# Patient Record
Sex: Male | Born: 1990 | Race: Black or African American | Hispanic: No | Marital: Single | Smoking: Never smoker
Health system: Southern US, Community
[De-identification: ages and names within clinical notes are randomized; demographics above are authoritative.]

## PROBLEM LIST (undated history)

## (undated) DIAGNOSIS — I10 Essential (primary) hypertension: Secondary | ICD-10-CM

---
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
## 2020-04-14 ENCOUNTER — Emergency Department (HOSPITAL_COMMUNITY)
Admission: EM | Admit: 2020-04-14 | Discharge: 2020-04-14 | Disposition: A | Discharge status: Home / Self Care | Payer: Self-pay | Attending: Emergency Medicine | Admitting: Emergency Medicine

## 2020-04-14 ENCOUNTER — Encounter (HOSPITAL_COMMUNITY): Payer: Self-pay | Admitting: Emergency Medicine

## 2020-04-14 ENCOUNTER — Emergency Department (HOSPITAL_COMMUNITY): Payer: Self-pay

## 2020-04-14 DIAGNOSIS — N50819 Testicular pain, unspecified: Secondary | ICD-10-CM | POA: Insufficient documentation

## 2020-04-14 DIAGNOSIS — R1030 Lower abdominal pain, unspecified: Secondary | ICD-10-CM | POA: Insufficient documentation

## 2020-04-14 DIAGNOSIS — R82998 Other abnormal findings in urine: Secondary | ICD-10-CM | POA: Insufficient documentation

## 2020-04-14 LAB — COMPREHENSIVE METABOLIC PANEL
ALT: 25 U/L (ref 0–44)
AST: 15 U/L (ref 15–41)
Albumin: 4.5 g/dL (ref 3.5–5.0)
Alkaline Phosphatase: 41 U/L (ref 38–126)
Anion gap: 7 (ref 5–15)
BUN: 11 mg/dL (ref 6–20)
CO2: 28 mmol/L (ref 22–32)
Calcium: 9.6 mg/dL (ref 8.9–10.3)
Chloride: 102 mmol/L (ref 98–111)
Creatinine, Ser: 0.9 mg/dL (ref 0.61–1.24)
GFR calc non Af Amer: >60 mL/min (ref >60)
Glucose, Bld: 89 mg/dL (ref 70–99)
Potassium: 4.3 mmol/L (ref 3.5–5.1)
Sodium: 137 mmol/L (ref 135–145)
Total Bilirubin: 0.9 mg/dL (ref 0.3–1.2)
Total Protein: 7.5 g/dL (ref 6.5–8.1)

## 2020-04-14 LAB — URINALYSIS, ROUTINE W REFLEX MICROSCOPIC
Bilirubin Urine: NEGATIVE
Glucose, UA: NEGATIVE mg/dL
Hgb urine dipstick: NEGATIVE
Ketones, ur: NEGATIVE mg/dL
Leukocytes,Ua: NEGATIVE
Nitrite: NEGATIVE
Protein, ur: NEGATIVE mg/dL
Specific Gravity, Urine: 1.006 (ref 1.005–1.030)
pH: 7 (ref 5.0–8.0)

## 2020-04-14 LAB — LIPASE, BLOOD: Lipase: 29 U/L (ref 11–51)

## 2020-04-14 LAB — CBC
HCT: 45.9 % (ref 39.0–52.0)
Hemoglobin: 15.7 g/dL (ref 13.0–17.0)
MCH: 31.1 pg (ref 26.0–34.0)
MCHC: 34.2 g/dL (ref 30.0–36.0)
MCV: 90.9 fL (ref 80.0–100.0)
Platelets: 225 10*3/uL (ref 150–400)
RBC: 5.05 MIL/uL (ref 4.22–5.81)
RDW: 11.4 % — ABNORMAL LOW (ref 11.5–15.5)
WBC: 3.1 10*3/uL — ABNORMAL LOW (ref 4.0–10.5)
nRBC: 0 % (ref 0.0–0.2)

## 2020-04-14 MED ORDER — IOHEXOL 300 MG/ML  SOLN
100.0000 mL | Freq: Once | INTRAMUSCULAR | Status: AC | PRN
  Administered 2020-04-14: 100 mL via INTRAVENOUS

## 2020-04-14 NOTE — ED Triage Notes (Signed)
Per pt, states lower abdominal pain and cramping on and off for 2 days-no other symptoms

## 2020-04-14 NOTE — ED Provider Notes (Signed)
[facility]-EMERGENCY DEPT Provider Note   CSN: [id number] Arrival date & time: 04/14/20  1354     History Chief Complaint  Patient presents with  . Abdominal Pain    John Collier is a 29 y.o. male.  He has no significant past medical history.  Complaining of 2 days of crampy low abdominal pain and strong urine smell.  No dysuria.  Denies any penile discharge.  Some radiation of pain into the testicles.  Last moved his bowels 2 days ago.  No fevers or chills.  No chest pain or shortness of breath.  No nausea or vomiting.  No prior abdominal surgeries  The history is provided by the patient.  Abdominal Cramping This is a new problem. The current episode started 2 days ago. The problem occurs constantly. The problem has not changed since onset.Associated symptoms include abdominal pain. Pertinent negatives include no chest pain, no headaches and no shortness of breath. Nothing aggravates the symptoms. Nothing relieves the symptoms. He has tried nothing for the symptoms. The treatment provided no relief.       History reviewed. No pertinent past medical history.  There are no problems to display for this patient.   History reviewed. No pertinent surgical history.     No family history on file.  Social History   Tobacco Use  . Smoking status: Not on file  Substance Use Topics  . Alcohol use: Not on file  . Drug use: Not on file    Home Medications Prior to Admission medications   Not on File    Allergies    Patient has no allergy information on record.  Review of Systems   Review of Systems  Constitutional: Negative for fever.  HENT: Negative for sore throat.   Eyes: Negative for visual disturbance.  Respiratory: Negative for shortness of breath.   Cardiovascular: Negative for chest pain.  Gastrointestinal: Positive for abdominal pain.  Genitourinary: Negative for dysuria.  Musculoskeletal: Negative for back pain.  Skin: Negative for rash.   Neurological: Negative for headaches.    Physical Exam Updated Vital Signs BP (!) 162/96 (BP Location: Left Arm)   Pulse (!) 57   Temp 99 F (37.2 C) (Oral)   Resp 18   SpO2 99%   Physical Exam Vitals and nursing note reviewed.  Constitutional:      Appearance: He is well-developed.  HENT:     Head: Normocephalic and atraumatic.  Eyes:     Conjunctiva/sclera: Conjunctivae normal.  Cardiovascular:     Rate and Rhythm: Normal rate and regular rhythm.     Heart sounds: No murmur heard.   Pulmonary:     Effort: Pulmonary effort is normal. No respiratory distress.     Breath sounds: Normal breath sounds.  Abdominal:     Palpations: Abdomen is soft.     Tenderness: There is no abdominal tenderness.  Genitourinary:    Testes: Normal.        Right: Mass or swelling not present.        Left: Mass or swelling not present.  Musculoskeletal:        General: No deformity or signs of injury. Normal range of motion.     Cervical back: Neck supple.  Skin:    General: Skin is warm and dry.     Capillary Refill: Capillary refill takes less than 2 seconds.  Neurological:     General: No focal deficit present.     Mental Status: He is alert.  ED Results / Procedures / Treatments   Labs (all labs ordered are listed, but only abnormal results are displayed) Labs Reviewed  CBC - Abnormal; Notable for the following components:      Result Value   WBC 3.1 (*)    RDW 11.4 (*)    All other components within normal limits  URINALYSIS, ROUTINE W REFLEX MICROSCOPIC - Abnormal; Notable for the following components:   Color, Urine STRAW (*)    All other components within normal limits  LIPASE, BLOOD  COMPREHENSIVE METABOLIC PANEL    EKG None  Radiology CT Abdomen Pelvis W Contrast  Result Date: 04/14/2020 CLINICAL DATA:  RLQ abdominal pain, appendicitis suspected EXAM: CT ABDOMEN AND PELVIS WITH CONTRAST TECHNIQUE: Multidetector CT imaging of the abdomen and pelvis was  performed using the standard protocol following bolus administration of intravenous contrast. CONTRAST:  OMNIPAQUE IOHEXOL 300 MG/ML  SOLN COMPARISON:  None. FINDINGS: Lower chest: No acute abnormality. Left lower lobe subpleural micronodule likely represents atelectasis. Hepatobiliary: No focal liver abnormality. No gallstones, gallbladder wall thickening, or pericholecystic fluid. No biliary dilatation. Pancreas: No focal lesion. Normal pancreatic contour. No surrounding inflammatory changes. No main pancreatic ductal dilatation. Spleen: Normal in size without focal abnormality. A splenule is noted inferior to the spleen. Adrenals/Urinary Tract: No adrenal nodule bilaterally. Bilateral kidneys enhance symmetrically. No hydronephrosis. No hydroureter. The urinary bladder is unremarkable. Stomach/Bowel: Stomach is within normal limits. Suggestion of a tubular blind ending structure within the right lower quadrant with intraluminal gas (5:38-44) may represent the appendix. No evidence of bowel wall thickening, distention, or inflammatory changes. Vascular/Lymphatic: No significant vascular findings are present. No enlarged abdominal or pelvic lymph nodes. Reproductive: Prostate is unremarkable. Other: No intraperitoneal free fluid. No intraperitoneal free gas. No organized fluid collection. Musculoskeletal: No acute or significant osseous findings. IMPRESSION: 1. The appendix is not definitely identified with no right lower quadrant inflammatory changes to suggest acute appendicitis. Limited evaluation due to paucity of intraperitoneal fat. 2. Otherwise no acute intra-abdominal intrapelvic abnormality. Electronically Signed   By: Tish Frederickson M.D.   On: 04/14/2020 18:49    Procedures Procedures (including critical care time)  Medications Ordered in ED Medications  iohexol (OMNIPAQUE) 300 MG/ML solution 100 mL (100 mLs Intravenous Contrast Given 04/14/20 1823)    ED Course  I have reviewed the  triage vital signs and the nursing notes.  Pertinent labs & imaging results that were available during my care of the patient were reviewed by me and considered in my medical decision making (see chart for details).    MDM Rules/Calculators/A&P                         Healthy male here with 2 days of crampy low abdominal pain and some vague urinary symptoms.  Exam benign and normal vitals.  Lab work does not show any significant abnormalities other than a nonspecific low white blood cell count.  Urinalysis without signs of infection.  Ordered CT imaging to exclude diverticulitis appendicitis and although the appendix was not identified did not see any inflammatory changes.  When I reviewed the images he does look like he has a large amount of stool.  Reviewed all his work-up with him.  Recommended increasing fluids and fiber and possibly trying a laxative.  Return instructions discussed.  Final Clinical Impression(s) / ED Diagnoses Final diagnoses:  Lower abdominal pain    Rx / DC Orders ED Discharge Orders    None  Terrilee Files, MD 04/15/20 1047

## 2020-04-14 NOTE — ED Notes (Signed)
Patient transported to CT 

## 2020-04-14 NOTE — Discharge Instructions (Signed)
You were seen in the emergency department for lower abdominal pain and cramps.  You had blood work did not show any serious findings along with a normal urinalysis.  Your CAT scan also did not show an obvious explanation for your pain.  This may be related to constipation and you should try an over-the-counter laxative.  Drink plenty of fluids and increase your fiber intake.  Return to the emergency department for any fever or worsening symptoms.

## 2020-04-14 NOTE — ED Notes (Signed)
Urine culture sent to lab.

## 2020-04-15 ENCOUNTER — Encounter (HOSPITAL_COMMUNITY): Payer: Self-pay | Admitting: Emergency Medicine

## 2020-07-03 ENCOUNTER — Emergency Department (HOSPITAL_COMMUNITY)
Admission: EM | Admit: 2020-07-03 | Discharge: 2020-07-03 | Disposition: A | Discharge status: Home / Self Care | Payer: Self-pay | Attending: Emergency Medicine | Admitting: Emergency Medicine

## 2020-07-03 ENCOUNTER — Other Ambulatory Visit: Payer: Self-pay

## 2020-07-03 DIAGNOSIS — I10 Essential (primary) hypertension: Secondary | ICD-10-CM | POA: Insufficient documentation

## 2020-07-03 DIAGNOSIS — R0789 Other chest pain: Secondary | ICD-10-CM | POA: Insufficient documentation

## 2020-07-03 LAB — BASIC METABOLIC PANEL
Anion gap: 10 (ref 5–15)
BUN: 10 mg/dL (ref 6–20)
CO2: 25 mmol/L (ref 22–32)
Calcium: 9.6 mg/dL (ref 8.9–10.3)
Chloride: 104 mmol/L (ref 98–111)
Creatinine, Ser: 0.95 mg/dL (ref 0.61–1.24)
GFR, Estimated: >60 mL/min (ref >60)
Glucose, Bld: 82 mg/dL (ref 70–99)
Potassium: 4.1 mmol/L (ref 3.5–5.1)
Sodium: 139 mmol/L (ref 135–145)

## 2020-07-03 LAB — CBC
HCT: 45.2 % (ref 39.0–52.0)
Hemoglobin: 15.8 g/dL (ref 13.0–17.0)
MCH: 31.8 pg (ref 26.0–34.0)
MCHC: 35 g/dL (ref 30.0–36.0)
MCV: 90.9 fL (ref 80.0–100.0)
Platelets: 237 10*3/uL (ref 150–400)
RBC: 4.97 MIL/uL (ref 4.22–5.81)
RDW: 11 % — ABNORMAL LOW (ref 11.5–15.5)
WBC: 4.5 10*3/uL (ref 4.0–10.5)
nRBC: 0 % (ref 0.0–0.2)

## 2020-07-03 LAB — TROPONIN I (HIGH SENSITIVITY): Troponin I (High Sensitivity): 9 ng/L (ref <18)

## 2020-07-03 MED ORDER — IBUPROFEN 400 MG PO TABS
600.0000 mg | ORAL_TABLET | Freq: Once | ORAL | Status: AC
  Administered 2020-07-03: 600 mg via ORAL
  Filled 2020-07-03: qty 1

## 2020-07-03 NOTE — Discharge Instructions (Addendum)
You may alternate Tylenol 1000 mg every 6 hours as needed for pain, fever and Ibuprofen 800 mg every 8 hours as needed for pain, fever.  Please take Ibuprofen with food.  Do not take more than 4000 mg of Tylenol (acetaminophen) in a 24 hour period.  Steps to find a Primary Care Provider (PCP):  Call [phone] or [phone] to access "[facility] Find a Doctor Service."  2.  You may also go on the [facility] website at [email or web address]  3.  [facility] also frequently accepts new patients.  [facility]  [address] [place] [address] [phone]  4.  There are also multiple [facility], [facility], [facility] and [facility] practices throughout the [place] that are frequently accepting new patients. You may find a clinic that is close to your home and contact them.  [email or web address] [phone]  [email or web address]  [email or web address] [phone]  [email or web address] [phone]  5.  Local Health Departments also can provide primary care services.  [facility]  [address] [place], [place] [address] [phone]  [facility] [address] [place] [address] [phone]  [facility] [phone]  [place] [address] [phone]   

## 2020-07-03 NOTE — ED Notes (Signed)
E-signature pad unavailable at time of pt discharge. This RN discussed discharge materials with pt and answered all pt questions. Pt stated understanding of discharge material. ? ?

## 2020-07-03 NOTE — ED Notes (Signed)
Pt refused x-ray when x-ray transport came. This RN explained the reason behind his ordered x-ray. Pt acknowledged reasoning and refused.

## 2020-07-03 NOTE — ED Triage Notes (Signed)
Pt presents to ED POV. Pt c/o L CP that radiates towards neck. Pt reports that he feels like his HR is irregular, he feels like it speeds up and slows down.

## 2020-07-03 NOTE — ED Provider Notes (Signed)
TIME SEEN: 4:10 AM  CHIEF COMPLAINT: Chest pain  HPI: Patient is a 29 year old male with previous history of hypertension no longer on medication who presents to the emergency department with left stabbing and tight chest pain for the past couple of days.  No aggravating or alleviating factors.  Pain is not exertional or pleuritic.  No shortness of breath, nausea, vomiting, diaphoresis or dizziness.  No fever or cough.  No history of PE, DVT, exogenous estrogen use, recent fractures, surgery, trauma, hospitalization, prolonged travel or other immobilization. No lower extremity swelling or pain. No calf tenderness.  States he felt like his heart rate was irregular like it was speeding up and slowing down which is his main reason for coming to the emergency department.  ROS: See HPI Constitutional: no fever  Eyes: no drainage  ENT: no runny nose   Cardiovascular:   chest pain  Resp: no SOB  GI: no vomiting GU: no dysuria Integumentary: no rash  Allergy: no hives  Musculoskeletal: no leg swelling  Neurological: no slurred speech ROS otherwise negative  PAST MEDICAL HISTORY/PAST SURGICAL HISTORY:  No past medical history on file.  MEDICATIONS:  Prior to Admission medications   Not on File    ALLERGIES:  No Known Allergies  SOCIAL HISTORY:  Social History   Tobacco Use  . Smoking status: Not on file  . Smokeless tobacco: Not on file  Substance Use Topics  . Alcohol use: Not on file    FAMILY HISTORY: No family history on file.  EXAM: BP (!) 154/107   Pulse 65   Temp 98.1 F (36.7 C) (Oral)   Resp 18   SpO2 100%  CONSTITUTIONAL: Alert and oriented and responds appropriately to questions. Well-appearing; well-nourished HEAD: Normocephalic EYES: Conjunctivae clear, pupils appear equal, EOM appear intact ENT: normal nose; moist mucous membranes NECK: Supple, normal ROM CARD: RRR; S1 and S2 appreciated; no murmurs, no clicks, no rubs, no gallops CHEST:  Chest wall is  nontender to palpation.  No crepitus, ecchymosis, erythema, warmth, rash or other lesions present.   RESP: Normal chest excursion without splinting or tachypnea; breath sounds clear and equal bilaterally; no wheezes, no rhonchi, no rales, no hypoxia or respiratory distress, speaking full sentences ABD/GI: Normal bowel sounds; non-distended; soft, non-tender, no rebound, no guarding, no peritoneal signs, no hepatosplenomegaly BACK:  The back appears normal EXT: Normal ROM in all joints; no deformity noted, no edema; no cyanosis; no calf tenderness or calf swelling SKIN: Normal color for age and race; warm; no rash on exposed skin NEURO: Moves all extremities equally PSYCH: The patient's mood and manner are appropriate.   MEDICAL DECISION MAKING: Patient here with atypical chest pain.  Pain has been ongoing for 2 days.  He has had a negative high-sensitivity troponin here.  He is PERC negative.  He is hypertensive here and states that he took himself off of blood pressure medication a couple years ago.  He has had some intermittent normal blood pressures in the 140s/80s.  He is asymptomatic of his hypertension other than atypical chest pain.  I do not think the he is having a dissection, ACS or PE.  I did discuss with him that I recommend close follow-up with outpatient provider to discuss starting him back on blood pressure medications.  Recommended chest x-ray.  Will give ibuprofen for pain.  EKG nonischemic.  ED PROGRESS: Patient now refusing chest x-ray.  Risk and benefits have been discussed with him.  He does not appear volume  overloaded.  No infectious symptoms to suggest pneumonia.  I do not think this is a pneumothorax as he has equal breath sounds bilaterally and is not hypoxic.  I feel he is safe for discharge.  Discussed return precautions.  Given outpatient PCP follow-up information.  At this time, I do not feel there is any life-threatening condition present. I have reviewed, interpreted and  discussed all results (EKG, imaging, lab, urine as appropriate) and exam findings with patient/family. I have reviewed nursing notes and appropriate previous records.  I feel the patient is safe to be discharged home without further emergent workup and can continue workup as an outpatient as needed. Discussed usual and customary return precautions. Patient/family verbalize understanding and are comfortable with this plan.  Outpatient follow-up has been provided as needed. All questions have been answered.     EKG Interpretation  Date/Time:  Friday July 03 2020 01:24:01 EST Ventricular Rate:  75 PR Interval:  144 QRS Duration: 90 QT Interval:  368 QTC Calculation: 410 R Axis:   76 Text Interpretation: Normal sinus rhythm Normal ECG No old tracing to compare Confirmed by Joia Doyle, Baxter Hire [phone]) on 07/03/2020 4:09:21 AM           Drucie Ip was evaluated in Emergency Department on 07/03/2020 for the symptoms described in the history of present illness. He was evaluated in the context of the global COVID-19 pandemic, which necessitated consideration that the patient might be at risk for infection with the SARS-CoV-2 virus that causes COVID-19. Institutional protocols and algorithms that pertain to the evaluation of patients at risk for COVID-19 are in a state of rapid change based on information released by regulatory bodies including the CDC and federal and state organizations. These policies and algorithms were followed during the patient's care in the ED.      Trenell Moxey, Layla Maw, DO 07/03/20 [phone]

## 2020-09-07 ENCOUNTER — Emergency Department (HOSPITAL_COMMUNITY)
Admission: EM | Admit: 2020-09-07 | Discharge: 2020-09-07 | Disposition: A | Discharge status: Home / Self Care | Payer: Self-pay | Attending: Emergency Medicine | Admitting: Emergency Medicine

## 2020-09-07 ENCOUNTER — Encounter (HOSPITAL_COMMUNITY): Payer: Self-pay

## 2020-09-07 ENCOUNTER — Other Ambulatory Visit: Payer: Self-pay

## 2020-09-07 DIAGNOSIS — I10 Essential (primary) hypertension: Secondary | ICD-10-CM | POA: Insufficient documentation

## 2020-09-07 DIAGNOSIS — R350 Frequency of micturition: Secondary | ICD-10-CM | POA: Insufficient documentation

## 2020-09-07 HISTORY — DX: Essential (primary) hypertension: I10

## 2020-09-07 LAB — URINALYSIS, ROUTINE W REFLEX MICROSCOPIC
Bilirubin Urine: NEGATIVE
Glucose, UA: NEGATIVE mg/dL
Hgb urine dipstick: NEGATIVE
Ketones, ur: NEGATIVE mg/dL
Leukocytes,Ua: NEGATIVE
Nitrite: NEGATIVE
Protein, ur: NEGATIVE mg/dL
Specific Gravity, Urine: 1.012 (ref 1.005–1.030)
pH: 7 (ref 5.0–8.0)

## 2020-09-07 NOTE — ED Triage Notes (Signed)
Pt c/o urinary frequency x 3 days w lower abd pain (intermittent). Denies burning or foul smell w urination. Denies n/v/d.

## 2020-09-07 NOTE — Discharge Instructions (Signed)
I strongly recommend following up with your primary care doctor regarding your symptoms from today.  If your gonorrhea/chlamydia test is positive, you will need further treatment.  If you develop any testicle pain, abdominal pain, vomiting, fevers or other new concerning symptom, recommend return to ER for reassessment.

## 2020-09-07 NOTE — ED Provider Notes (Signed)
[facility]-EMERGENCY DEPT Provider Note   CSN: [id number] Arrival date & time: 09/07/20  1834     History Chief Complaint  Patient presents with  . Urinary Frequency    John Collier is a 30 y.o. male.  Presented to ER with concern for increased urinary frequency.  Patient reports that over the last 3 days he feels like he has been urinating more frequently.  Also felt like he had some lower abdominal pain at one point earlier today when he was sitting in certain positions.  He does not have any ongoing pain at present.  No vomiting, fever.  No burning or foul-smelling urine.  No penile discharge.  No testicular pain or swelling at present.  Sexually active with 2 male partners over the past few months.  No known STD exposures.  Use condoms most of the time.  Reports that he had been tested for STDs a few months ago and was negative.  HPI     Past Medical History:  Diagnosis Date  . Hypertension     There are no problems to display for this patient.   History reviewed. No pertinent surgical history.     No family history on file.  Social History   Tobacco Use  . Smoking status: Never Smoker  . Smokeless tobacco: Never Used  Substance Use Topics  . Alcohol use: Not Currently  . Drug use: Not Currently    Home Medications Prior to Admission medications   Not on File    Allergies    Patient has no known allergies.  Review of Systems   Review of Systems  Constitutional: Negative for chills and fever.  HENT: Negative for ear pain and sore throat.   Eyes: Negative for pain and visual disturbance.  Respiratory: Negative for cough and shortness of breath.   Cardiovascular: Negative for chest pain and palpitations.  Gastrointestinal: Negative for abdominal pain and vomiting.  Genitourinary: Positive for frequency. Negative for dysuria and hematuria.  Musculoskeletal: Negative for arthralgias and back pain.  Skin: Negative for color change  and rash.  Neurological: Negative for seizures and syncope.  All other systems reviewed and are negative.   Physical Exam Updated Vital Signs BP (!) 150/95 (BP Location: Right Arm)   Pulse 99   Temp 99.1 F (37.3 C) (Oral)   Resp 16   Ht 5\' 8"  (1.727 m)   Wt 65.6 kg   SpO2 100%   BMI 21.99 kg/m   Physical Exam Vitals and nursing note reviewed.  Constitutional:      Appearance: He is well-developed and well-nourished.  HENT:     Head: Normocephalic and atraumatic.  Eyes:     Conjunctiva/sclera: Conjunctivae normal.  Cardiovascular:     Rate and Rhythm: Normal rate.     Pulses: Normal pulses.  Pulmonary:     Effort: Pulmonary effort is normal. No respiratory distress.  Abdominal:     General: Abdomen is flat. There is no distension.     Palpations: Abdomen is soft. There is no mass.     Tenderness: There is no abdominal tenderness.  Genitourinary:    Penis: Normal.      Testes: Normal.     Comments: Rebecca RN chaperone  Normal-appearing penis, normal-appearing testicles, no swelling, no tenderness, cremasteric reflex intact, no hernia in bilateral inguinal region Musculoskeletal:        General: No edema.     Cervical back: Neck supple.  Skin:    General: Skin is  warm and dry.  Neurological:     General: No focal deficit present.     Mental Status: He is alert.  Psychiatric:        Mood and Affect: Mood and affect and mood normal.        Behavior: Behavior normal.     ED Results / Procedures / Treatments   Labs (all labs ordered are listed, but only abnormal results are displayed) Labs Reviewed  URINALYSIS, ROUTINE W REFLEX MICROSCOPIC  GC/CHLAMYDIA PROBE AMP () NOT AT [facility]    EKG None  Radiology No results found.  Procedures Procedures   Medications Ordered in ED Medications - No data to display  ED Course  I have reviewed the triage vital signs and the nursing notes.  Pertinent labs & imaging results that were available during  my care of the patient were reviewed by me and considered in my medical decision making (see chart for details).    MDM Rules/Calculators/A&P                         30 year old male presents to ER with concern for increased urinary frequency.  On physical exam, he is noted to be well-appearing in no distress with normal vital signs.  He had no tenderness on my abdominal exam.  GU exam was unremarkable.  UA negative for infection.  Given reassuring exam, UA, recommend observation at present.  Discussed return precautions should he develop other associated symptoms.  Recommended follow-up with primary.  After the discussed management above, the patient was determined to be safe for discharge.  The patient was in agreement with this plan and all questions regarding their care were answered.  ED return precautions were discussed and the patient will return to the ED with any significant worsening of condition.    Final Clinical Impression(s) / ED Diagnoses Final diagnoses:  Urinary frequency    Rx / DC Orders ED Discharge Orders    None       Milagros Loll, MD 09/07/20 2015

## 2020-09-07 NOTE — ED Notes (Signed)
An After Visit Summary was printed and given to the patient. Discharge instructions given and no further questions at this time.  

## 2021-02-19 ENCOUNTER — Emergency Department (HOSPITAL_COMMUNITY): Payer: Self-pay

## 2021-02-19 ENCOUNTER — Emergency Department (HOSPITAL_COMMUNITY)
Admission: EM | Admit: 2021-02-19 | Discharge: 2021-02-19 | Disposition: A | Discharge status: Home / Self Care | Payer: Self-pay | Attending: Emergency Medicine | Admitting: Emergency Medicine

## 2021-02-19 ENCOUNTER — Other Ambulatory Visit: Payer: Self-pay

## 2021-02-19 ENCOUNTER — Encounter (HOSPITAL_COMMUNITY): Payer: Self-pay | Admitting: Emergency Medicine

## 2021-02-19 DIAGNOSIS — I10 Essential (primary) hypertension: Secondary | ICD-10-CM | POA: Insufficient documentation

## 2021-02-19 DIAGNOSIS — R002 Palpitations: Secondary | ICD-10-CM | POA: Insufficient documentation

## 2021-02-19 DIAGNOSIS — R079 Chest pain, unspecified: Secondary | ICD-10-CM | POA: Insufficient documentation

## 2021-02-19 LAB — BASIC METABOLIC PANEL
Anion gap: 8 (ref 5–15)
BUN: 11 mg/dL (ref 6–20)
CO2: 26 mmol/L (ref 22–32)
Calcium: 9.2 mg/dL (ref 8.9–10.3)
Chloride: 106 mmol/L (ref 98–111)
Creatinine, Ser: 0.86 mg/dL (ref 0.61–1.24)
GFR, Estimated: >60 mL/min (ref >60)
Glucose, Bld: 75 mg/dL (ref 70–99)
Potassium: 3.7 mmol/L (ref 3.5–5.1)
Sodium: 140 mmol/L (ref 135–145)

## 2021-02-19 LAB — CBC
HCT: 46.6 % (ref 39.0–52.0)
Hemoglobin: 15.6 g/dL (ref 13.0–17.0)
MCH: 30.5 pg (ref 26.0–34.0)
MCHC: 33.5 g/dL (ref 30.0–36.0)
MCV: 91.2 fL (ref 80.0–100.0)
Platelets: 216 10*3/uL (ref 150–400)
RBC: 5.11 MIL/uL (ref 4.22–5.81)
RDW: 11.2 % — ABNORMAL LOW (ref 11.5–15.5)
WBC: 3.7 10*3/uL — ABNORMAL LOW (ref 4.0–10.5)
nRBC: 0 % (ref 0.0–0.2)

## 2021-02-19 LAB — TROPONIN I (HIGH SENSITIVITY): Troponin I (High Sensitivity): <2 ng/L (ref <18)

## 2021-02-19 MED ORDER — LISINOPRIL 5 MG PO TABS: 5.0000 mg | ORAL_TABLET | Freq: Every day | ORAL | 0 refills | Status: AC

## 2021-02-19 NOTE — Discharge Instructions (Addendum)
Take lisinopril 5 mg daily.  You need to follow-up with the wellness clinic to recheck her blood pressure  Return to ER if you have worse palpitations, chest pain, shortness of breath

## 2021-02-19 NOTE — ED Triage Notes (Addendum)
Pt reports left sided chest pain and palpitations x 1 week. Pt concerned sx are related to his elevated BP. Hx of HTN.

## 2021-02-19 NOTE — ED Provider Notes (Signed)
[facility]-EMERGENCY DEPT Provider Note   CSN: [id number] Arrival date & time: 02/19/21  1918     History Chief Complaint  Patient presents with   Chest Pain   Palpitations    John Collier is a 30 y.o. male hx of HTN, here with palpitations, hypertension.  Patient states that he has a history of hypertension supposed to be on lisinopril 10 mg daily.  He has stopped taking it about 10 years ago.  He states that the last several days he noticed some palpitations.  He denies any chest pain.  He states that last time he had the symptoms, he was noted to have elevated blood pressure.  Denies any chest pain or shortness of breath  The history is provided by the patient.      Past Medical History:  Diagnosis Date   Hypertension     There are no problems to display for this patient.   History reviewed. No pertinent surgical history.     No family history on file.  Social History   Tobacco Use   Smoking status: Never   Smokeless tobacco: Never  Substance Use Topics   Alcohol use: Not Currently   Drug use: Not Currently    Home Medications Prior to Admission medications   Not on File    Allergies    Patient has no known allergies.  Review of Systems   Review of Systems  Cardiovascular:  Positive for chest pain.  All other systems reviewed and are negative.  Physical Exam Updated Vital Signs BP (!) 136/92 (BP Location: Left Arm)   Pulse 77   Temp 98.1 F (36.7 C) (Oral)   Resp 13   Ht 5\' 8"  (1.727 m)   Wt 65.3 kg   SpO2 99%   BMI 21.90 kg/m   Physical Exam Vitals and nursing note reviewed.  Constitutional:      Appearance: He is well-developed.  HENT:     Head: Normocephalic.  Eyes:     Pupils: Pupils are equal, round, and reactive to light.  Cardiovascular:     Rate and Rhythm: Normal rate and regular rhythm.     Heart sounds: Normal heart sounds.  Pulmonary:     Effort: Pulmonary effort is normal.     Breath sounds:  Normal breath sounds.  Abdominal:     General: Bowel sounds are normal.     Palpations: Abdomen is soft.  Musculoskeletal:        General: Normal range of motion.     Cervical back: Normal range of motion.  Skin:    General: Skin is warm.     Capillary Refill: Capillary refill takes less than 2 seconds.  Neurological:     General: No focal deficit present.     Mental Status: He is alert and oriented to person, place, and time.  Psychiatric:        Mood and Affect: Mood normal.        Behavior: Behavior normal.    ED Results / Procedures / Treatments   Labs (all labs ordered are listed, but only abnormal results are displayed) Labs Reviewed  CBC - Abnormal; Notable for the following components:      Result Value   WBC 3.7 (*)    RDW 11.2 (*)    All other components within normal limits  BASIC METABOLIC PANEL  TROPONIN I (HIGH SENSITIVITY)    EKG EKG Interpretation  Date/Time:  Friday February 19 2021 19:27:25 EDT Ventricular  Rate:  76 PR Interval:  135 QRS Duration: 82 QT Interval:  352 QTC Calculation: 396 R Axis:   71 Text Interpretation: Sinus rhythm Consider left atrial enlargement LVH by voltage ST elevation suggests acute pericarditis 12 Lead; Mason-Likar No significant change since last tracing Confirmed by Richardean Canal [phone]) on 02/19/2021 7:55:30 PM  Radiology DG Chest 2 View  Result Date: 02/19/2021 CLINICAL DATA:  Chest pain for several days EXAM: CHEST - 2 VIEW COMPARISON:  08/13/2006 FINDINGS: The heart size and mediastinal contours are within normal limits. Both lungs are clear. The visualized skeletal structures are unremarkable. IMPRESSION: No active cardiopulmonary disease. Electronically Signed   By: Alcide Clever M.D.   On: 02/19/2021 19:41    Procedures Procedures   Medications Ordered in ED Medications - No data to display  ED Course  I have reviewed the triage vital signs and the nursing notes.  Pertinent labs & imaging results that were  available during my care of the patient were reviewed by me and considered in my medical decision making (see chart for details).    MDM Rules/Calculators/A&P                          John Collier is a 30 y.o. male presenting with palpitations.  Patient has been having palpitations for the last week or so.  Patient's heart rate is in the 70s.  Patient is slightly hypertensive to the 150s. Patient was supposed to be on lisinopril.  We will check basic blood work and troponin x1.  Patient has no history of hyperthyroidism and has no obvious goiter so we will hold off on checking TSH.   9:17 PM Labs unremarkable.  BP down to 131/96 with no intervention. Will start lisinopril 5 mg daily    Final Clinical Impression(s) / ED Diagnoses Final diagnoses:  None    Rx / DC Orders ED Discharge Orders     None        Charlynne Pander, MD 02/19/21 2117

## 2021-04-22 ENCOUNTER — Emergency Department (HOSPITAL_COMMUNITY)
Admission: EM | Admit: 2021-04-22 | Discharge: 2021-04-23 | Disposition: A | Discharge status: Home / Self Care | Payer: Self-pay | Attending: Emergency Medicine | Admitting: Emergency Medicine

## 2021-04-22 ENCOUNTER — Other Ambulatory Visit: Payer: Self-pay

## 2021-04-22 DIAGNOSIS — Z79899 Other long term (current) drug therapy: Secondary | ICD-10-CM | POA: Insufficient documentation

## 2021-04-22 DIAGNOSIS — I10 Essential (primary) hypertension: Secondary | ICD-10-CM | POA: Insufficient documentation

## 2021-04-22 DIAGNOSIS — K602 Anal fissure, unspecified: Secondary | ICD-10-CM | POA: Insufficient documentation

## 2021-04-22 LAB — CBC WITH DIFFERENTIAL/PLATELET
Abs Immature Granulocytes: 0 10*3/uL (ref 0.00–0.07)
Basophils Absolute: 0 10*3/uL (ref 0.0–0.1)
Basophils Relative: 1 %
Eosinophils Absolute: 0 10*3/uL (ref 0.0–0.5)
Eosinophils Relative: 1 %
HCT: 45.7 % (ref 39.0–52.0)
Hemoglobin: 15.6 g/dL (ref 13.0–17.0)
Immature Granulocytes: 0 %
Lymphocytes Relative: 43 %
Lymphs Abs: 1.9 10*3/uL (ref 0.7–4.0)
MCH: 30.9 pg (ref 26.0–34.0)
MCHC: 34.1 g/dL (ref 30.0–36.0)
MCV: 90.5 fL (ref 80.0–100.0)
Monocytes Absolute: 0.4 10*3/uL (ref 0.1–1.0)
Monocytes Relative: 8 %
Neutro Abs: 2.1 10*3/uL (ref 1.7–7.7)
Neutrophils Relative %: 47 %
Platelets: 239 10*3/uL (ref 150–400)
RBC: 5.05 MIL/uL (ref 4.22–5.81)
RDW: 11.5 % (ref 11.5–15.5)
WBC: 4.5 10*3/uL (ref 4.0–10.5)
nRBC: 0 % (ref 0.0–0.2)

## 2021-04-22 NOTE — ED Provider Notes (Signed)
Emergency Medicine Provider Triage Evaluation Note  John Collier , a 30 y.o. male  was evaluated in triage.  Pt complains of "wanting to get his thyroid checked."  He states that for a year now he feels like he has been having "thyroid symptoms."  Asked for clarity on this he states that he has had a 15 pound weight loss in the past 6 to 8 months despite no change in his diet or exercise regimen.  He states that he is having difficulty sleeping.  Also states that for 3 years off-and-on he has had blood in his stool, particularly when he is straining.  States that he has a bowel movement about once every 3 days.  States that he has mild aching, intermittent right lower quadrant and left lower quadrant abdominal pain.  Denies bloating, fevers, night sweats, noticing any new lymphadenopathy.  Denies palpitations, chest pain, heat or cold intolerance.  Denies changes to his hair, nails, skin.  Review of Systems  Positive: Blood in stool Negative: Fever  Physical Exam  BP 135/89 (BP Location: Left Arm)   Pulse 82   Temp 98.8 F (37.1 C) (Oral)   Resp 16   Ht 5\' 6"  (1.676 m)   Wt 63.5 kg   SpO2 97%   BMI 22.60 kg/m  Gen:   Awake, no distress   Resp:  Normal effort  MSK:   Moves extremities without difficulty  Other:  No thyromegaly or bruits on exam.  No proptosis.  Abdomen is flat, soft.  He endorses tenderness to the right lower quadrant and left lower quadrant without guarding or rebound.  Medical Decision Making  Medically screening exam initiated at 11:20 PM.  Appropriate orders placed.  John Collier was informed that the remainder of the evaluation will be completed by another provider, this initial triage assessment does not replace that evaluation, and the importance of remaining in the ED until their evaluation is complete.     Drucie Ip, PA-C 04/22/21 2323    04/24/21, MD 04/22/21 [phone]

## 2021-04-23 ENCOUNTER — Encounter (HOSPITAL_COMMUNITY): Payer: Self-pay | Admitting: Emergency Medicine

## 2021-04-23 LAB — COMPREHENSIVE METABOLIC PANEL
ALT: 23 U/L (ref 0–44)
AST: 14 U/L — ABNORMAL LOW (ref 15–41)
Albumin: 4.8 g/dL (ref 3.5–5.0)
Alkaline Phosphatase: 50 U/L (ref 38–126)
Anion gap: 7 (ref 5–15)
BUN: 13 mg/dL (ref 6–20)
CO2: 25 mmol/L (ref 22–32)
Calcium: 9.5 mg/dL (ref 8.9–10.3)
Chloride: 105 mmol/L (ref 98–111)
Creatinine, Ser: 0.85 mg/dL (ref 0.61–1.24)
GFR, Estimated: >60 mL/min (ref >60)
Glucose, Bld: 84 mg/dL (ref 70–99)
Potassium: 4.1 mmol/L (ref 3.5–5.1)
Sodium: 137 mmol/L (ref 135–145)
Total Bilirubin: 1 mg/dL (ref 0.3–1.2)
Total Protein: 8 g/dL (ref 6.5–8.1)

## 2021-04-23 LAB — POC OCCULT BLOOD, ED: Fecal Occult Bld: NEGATIVE

## 2021-04-23 LAB — TSH: TSH: 2.437 u[IU]/mL (ref 0.350–4.500)

## 2021-04-23 NOTE — ED Provider Notes (Signed)
[facility]-EMERGENCY DEPT Provider Note   CSN: [id number] Arrival date & time: 04/22/21  2125     History No chief complaint on file.   John Collier is a 30 y.o. male.  The history is provided by the patient.  Rectal Bleeding Quality: red streaks. Amount:  Scant Duration: intermittently for a while,  usually straining when this happens. Timing:  Sporadic Chronicity:  Recurrent Context: defecation   Context: not anal penetration and not diarrhea   Similar prior episodes: yes   Relieved by:  Nothing Worsened by:  Defecation Ineffective treatments:  None tried Associated symptoms: no abdominal pain, no dizziness, no fever, no hematemesis, no light-headedness, no loss of consciousness, no recent illness and no vomiting   Risk factors: no anticoagulant use, no hx of colorectal cancer and no hx of colorectal surgery   Also thought he was hyperthyroid.  Also reports not having a family doctor.      Past Medical History:  Diagnosis Date   Hypertension     There are no problems to display for this patient.   History reviewed. No pertinent surgical history.     History reviewed. No pertinent family history.  Social History   Tobacco Use   Smoking status: Never   Smokeless tobacco: Never  Substance Use Topics   Alcohol use: Not Currently   Drug use: Not Currently    Home Medications Prior to Admission medications   Medication Sig Start Date End Date Taking? Authorizing Provider  lisinopril (ZESTRIL) 5 MG tablet Take 1 tablet (5 mg total) by mouth daily. 02/19/21   Charlynne Pander, MD    Allergies    Patient has no known allergies.  Review of Systems   Review of Systems  Constitutional:  Negative for fever.  HENT:  Negative for facial swelling.   Eyes:  Negative for redness.  Respiratory:  Negative for wheezing and stridor.   Cardiovascular:  Negative for leg swelling.  Gastrointestinal:  Positive for hematochezia. Negative for  abdominal pain, hematemesis and vomiting.  Genitourinary:  Negative for difficulty urinating.  Musculoskeletal:  Negative for neck stiffness.  Skin:  Negative for wound.  Neurological:  Negative for dizziness, loss of consciousness and light-headedness.  Psychiatric/Behavioral:  Negative for agitation.   All other systems reviewed and are negative.  Physical Exam Updated Vital Signs BP 135/89 (BP Location: Left Arm)   Pulse 82   Temp 98.8 F (37.1 C) (Oral)   Resp 16   Ht 5\' 6"  (1.676 m)   Wt 63.5 kg   SpO2 97%   BMI 22.60 kg/m   Physical Exam Vitals and nursing note reviewed. Exam conducted with a chaperone present.  Constitutional:      General: He is not in acute distress.    Appearance: Normal appearance.  HENT:     Head: Normocephalic and atraumatic.     Nose: Nose normal.  Eyes:     Conjunctiva/sclera: Conjunctivae normal.     Pupils: Pupils are equal, round, and reactive to light.  Cardiovascular:     Rate and Rhythm: Normal rate and regular rhythm.     Pulses: Normal pulses.     Heart sounds: Normal heart sounds.  Pulmonary:     Effort: Pulmonary effort is normal.     Breath sounds: Normal breath sounds.  Abdominal:     General: Abdomen is flat. Bowel sounds are normal.     Palpations: Abdomen is soft.     Tenderness: There is no abdominal  tenderness. There is no guarding.  Genitourinary:    Rectum: Guaiac result negative.     Comments: Small fissure, 6 o'clock position  Musculoskeletal:        General: Normal range of motion.     Cervical back: Normal range of motion and neck supple.  Skin:    General: Skin is warm and dry.     Capillary Refill: Capillary refill takes less than 2 seconds.  Neurological:     General: No focal deficit present.     Mental Status: He is alert and oriented to person, place, and time.     Deep Tendon Reflexes: Reflexes normal.  Psychiatric:        Mood and Affect: Mood normal.        Behavior: Behavior normal.    ED  Results / Procedures / Treatments   Labs (all labs ordered are listed, but only abnormal results are displayed) Labs Reviewed  COMPREHENSIVE METABOLIC PANEL - Abnormal; Notable for the following components:      Result Value   AST 14 (*)    All other components within normal limits  CBC WITH DIFFERENTIAL/PLATELET  TSH  POC OCCULT BLOOD, ED    EKG None  Radiology No results found.  Procedures Procedures   Medications Ordered in ED Medications - No data to display  ED Course  I have reviewed the triage vital signs and the nursing notes.  Pertinent labs & imaging results that were available during my care of the patient were reviewed by me and considered in my medical decision making (see chart for details).   Bleeding likely secondary to fissure.  None today, normal hemoglobin.  Thyroid is also normal.  I suspect he has some anxiety and I believe he needs a PMD with whom he can follow up with on a regular basis to address his issues. Will refer to community health and wellness.   John Collier was evaluated in Emergency Department on 04/23/2021 for the symptoms described in the history of present illness. He was evaluated in the context of the global COVID-19 pandemic, which necessitated consideration that the patient might be at risk for infection with the SARS-CoV-2 virus that causes COVID-19. Institutional protocols and algorithms that pertain to the evaluation of patients at risk for COVID-19 are in a state of rapid change based on information released by regulatory bodies including the CDC and federal and state organizations. These policies and algorithms were followed during the patient's care in the ED.  Final Clinical Impression(s) / ED Diagnoses Final diagnoses:  None  Return for intractable cough, coughing up blood, fevers > 100.4 unrelieved by medication, shortness of breath, intractable vomiting, chest pain, shortness of breath, weakness, numbness, changes in speech,  facial asymmetry, abdominal pain, passing out, Inability to tolerate liquids or food, cough, altered mental status or any concerns. No signs of systemic illness or infection. The patient is nontoxic-appearing on exam and vital signs are within normal limits.  I have reviewed the triage vital signs and the nursing notes. Pertinent labs & imaging results that were available during my care of the patient were reviewed by me and considered in my medical decision making (see chart for details). After history, exam, and medical workup I feel the patient has been appropriately medically screened and is safe for discharge home. Pertinent diagnoses were discussed with the patient. Patient was given return precautions.     Rx / DC Orders ED Discharge Orders     None  Jestina Stephani, MD 04/23/21 [phone]

## 2021-07-27 ENCOUNTER — Other Ambulatory Visit: Payer: Self-pay

## 2021-07-27 ENCOUNTER — Emergency Department (HOSPITAL_COMMUNITY)
Admission: EM | Admit: 2021-07-27 | Discharge: 2021-07-28 | Disposition: A | Discharge status: Home / Self Care | Payer: Self-pay | Attending: Emergency Medicine | Admitting: Emergency Medicine

## 2021-07-27 DIAGNOSIS — R197 Diarrhea, unspecified: Secondary | ICD-10-CM | POA: Insufficient documentation

## 2021-07-27 DIAGNOSIS — R1084 Generalized abdominal pain: Secondary | ICD-10-CM | POA: Insufficient documentation

## 2021-07-27 LAB — CBC
HCT: 44.8 % (ref 39.0–52.0)
Hemoglobin: 15.2 g/dL (ref 13.0–17.0)
MCH: 30.6 pg (ref 26.0–34.0)
MCHC: 33.9 g/dL (ref 30.0–36.0)
MCV: 90.3 fL (ref 80.0–100.0)
Platelets: 260 10*3/uL (ref 150–400)
RBC: 4.96 MIL/uL (ref 4.22–5.81)
RDW: 11.3 % — ABNORMAL LOW (ref 11.5–15.5)
WBC: 4.5 10*3/uL (ref 4.0–10.5)
nRBC: 0 % (ref 0.0–0.2)

## 2021-07-27 LAB — COMPREHENSIVE METABOLIC PANEL
ALT: 23 U/L (ref 0–44)
AST: 15 U/L (ref 15–41)
Albumin: 4.5 g/dL (ref 3.5–5.0)
Alkaline Phosphatase: 47 U/L (ref 38–126)
Anion gap: 7 (ref 5–15)
BUN: 17 mg/dL (ref 6–20)
CO2: 27 mmol/L (ref 22–32)
Calcium: 9.3 mg/dL (ref 8.9–10.3)
Chloride: 104 mmol/L (ref 98–111)
Creatinine, Ser: 0.76 mg/dL (ref 0.61–1.24)
GFR, Estimated: >60 mL/min (ref >60)
Glucose, Bld: 91 mg/dL (ref 70–99)
Potassium: 4 mmol/L (ref 3.5–5.1)
Sodium: 138 mmol/L (ref 135–145)
Total Bilirubin: 0.4 mg/dL (ref 0.3–1.2)
Total Protein: 7.3 g/dL (ref 6.5–8.1)

## 2021-07-27 LAB — URINALYSIS, ROUTINE W REFLEX MICROSCOPIC
Bilirubin Urine: NEGATIVE
Glucose, UA: NEGATIVE mg/dL
Hgb urine dipstick: NEGATIVE
Ketones, ur: NEGATIVE mg/dL
Leukocytes,Ua: NEGATIVE
Nitrite: NEGATIVE
Protein, ur: NEGATIVE mg/dL
Specific Gravity, Urine: 1.02 (ref 1.005–1.030)
pH: 6 (ref 5.0–8.0)

## 2021-07-27 LAB — LIPASE, BLOOD: Lipase: 45 U/L (ref 11–51)

## 2021-07-27 NOTE — ED Provider Triage Note (Signed)
Emergency Medicine Provider Triage Evaluation Note  John Collier , a 31 y.o. male  was evaluated in triage.  Pt complains of agile onset, constant, sharp, right lower quadrant abdominal pain that began last night.  Patient also complains of diarrhea.  He states that he ate mac & cheese last night prior to symptoms starting however others in the house ate the same thing and are not symptomatic.  He denies fevers or chills.  No previous abdominal surgeries.  Patient states that he googled his symptoms and is concerned that he could have inflammatory bowel disease.  He does mention that he has lost approximately 20 pounds in the past few months without trying.  Denies any blood in his stool.  Review of Systems  Positive: + abd pain, diarrhea Negative: - fevers, chills, vomiting  Physical Exam  BP (!) 134/93 (BP Location: Left Arm)    Pulse 80    Temp 98.6 F (37 C) (Oral)    Resp 16    Ht _0  (1.676 m)    Wt 64.4 kg    SpO2 99%    BMI 22.92 kg/m  Gen:   Awake, no distress   Resp:  Normal effort  MSK:   Moves extremities without difficulty  Other:  Diffuse abd TTP however worse in RLQ  Medical Decision Making  Medically screening exam initiated at 7:54 PM.  Appropriate orders placed.  Vishnu Moeller was informed that the remainder of the evaluation will be completed by another provider, this initial triage assessment does not replace that evaluation, and the importance of remaining in the ED until their evaluation is complete.     Eustaquio Maize, PA-C 07/27/21 1955

## 2021-07-27 NOTE — ED Triage Notes (Signed)
Pt c/o abdominal pain started last night. Pt report cloudy urine. Pt denies N/V. Pt denies fever. Pt a/xo4

## 2021-07-28 ENCOUNTER — Emergency Department (HOSPITAL_COMMUNITY): Payer: Self-pay

## 2021-07-28 ENCOUNTER — Encounter (HOSPITAL_COMMUNITY): Payer: Self-pay | Admitting: Student

## 2021-07-28 MED ORDER — SODIUM CHLORIDE 0.9 % IV BOLUS
1000.0000 mL | Freq: Once | INTRAVENOUS | Status: AC
  Administered 2021-07-28: 1000 mL via INTRAVENOUS

## 2021-07-28 MED ORDER — ONDANSETRON HCL 4 MG/2ML IJ SOLN
4.0000 mg | Freq: Once | INTRAMUSCULAR | Status: AC
  Administered 2021-07-28: 4 mg via INTRAVENOUS
  Filled 2021-07-28: qty 2

## 2021-07-28 MED ORDER — DICYCLOMINE HCL 20 MG PO TABS: 20.0000 mg | ORAL_TABLET | Freq: Three times a day (TID) | ORAL | 0 refills | Status: AC | PRN

## 2021-07-28 MED ORDER — SODIUM CHLORIDE (PF) 0.9 % IJ SOLN
INTRAMUSCULAR | Status: AC
  Filled 2021-07-28: qty 50

## 2021-07-28 MED ORDER — IOHEXOL 300 MG/ML  SOLN
100.0000 mL | Freq: Once | INTRAMUSCULAR | Status: AC | PRN
  Administered 2021-07-28: 100 mL via INTRAVENOUS

## 2021-07-28 MED ORDER — FENTANYL CITRATE PF 50 MCG/ML IJ SOSY
50.0000 ug | PREFILLED_SYRINGE | Freq: Once | INTRAMUSCULAR | Status: AC
  Administered 2021-07-28: 50 ug via INTRAVENOUS
  Filled 2021-07-28: qty 1

## 2021-07-28 NOTE — Discharge Instructions (Addendum)
You are seen in the emergency department today for abdominal pain.  Your blood work was overall reassuring.  Your CT scan did not show any significant abnormalities.  We are sending you home with the following medications to help with your symptoms:  - Bentyl-take every 8 hours as needed for abdominal pain/cramping.  We have prescribed you new medication(s) today. Discuss the medications prescribed today with your pharmacist as they can have adverse effects and interactions with your other medicines including over the counter and prescribed medications. Seek medical evaluation if you start to experience new or abnormal symptoms after taking one of these medicines, seek care immediately if you start to experience difficulty breathing, feeling of your throat closing, facial swelling, or rash as these could be indications of a more serious allergic reaction  Please follow attached diet guidelines.   Follow up with your primary care provider within 3 days for re-evaluation.  We have also provided GI information for follow-up. Return to the ER for new or worsening symptoms including but not limited to worsened pain, new pain, inability to keep fluids down, blood in vomit/stool, passing out, fever, change in location of pain, or any other concerns.

## 2021-07-28 NOTE — ED Provider Notes (Signed)
[facility] DEPT Provider Note   CSN: [id number] Arrival date & time: 07/27/21  1856     History  Chief Complaint  Patient presents with   Abdominal Pain    John Collier is a 31 y.o. male with a hx of hypertension who presents to the ED with complaints of abdominal pain over the past 24 hours.  Patient states the pain is generalized, more prominent in the right lower quadrant, constant, worse in certain positions, alleviated some with laying down.  Has had some associated diarrhea and thinks that his urine might have looked cloudy earlier..  Denies fever, vomiting, appetite change, dysuria, hematuria, penile discharge, or testicular pain/swelling.  Denies recent foreign travel or antibiotic use.  No prior abdominal surgeries.  Sexually active in a monogamous relationship without concern for STI.  HPI     Home Medications Prior to Admission medications   Medication Sig Start Date End Date Taking? Authorizing Provider  lisinopril (ZESTRIL) 5 MG tablet Take 1 tablet (5 mg total) by mouth daily. 02/19/21   Drenda Freeze, MD      Allergies    Patient has no known allergies.    Review of Systems   Review of Systems  Constitutional:  Negative for chills and fever.  Respiratory:  Negative for shortness of breath.   Cardiovascular:  Negative for chest pain.  Gastrointestinal:  Positive for abdominal pain and diarrhea. Negative for blood in stool, nausea and vomiting.  Genitourinary:  Negative for dysuria, penile discharge, scrotal swelling and testicular pain.  All other systems reviewed and are negative.  Physical Exam Updated Vital Signs BP (!) 134/93 (BP Location: Left Arm)    Pulse 80    Temp 98.6 F (37 C) (Oral)    Resp 16    Ht 5\' 6"  (1.676 m)    Wt 64.4 kg    SpO2 99%    BMI 22.92 kg/m  Physical Exam Vitals and nursing note reviewed.  Constitutional:      General: He is not in acute distress.    Appearance: He is well-developed. He is  not toxic-appearing.  HENT:     Head: Normocephalic and atraumatic.  Eyes:     General:        Right eye: No discharge.        Left eye: No discharge.     Conjunctiva/sclera: Conjunctivae normal.  Cardiovascular:     Rate and Rhythm: Normal rate and regular rhythm.  Pulmonary:     Effort: Pulmonary effort is normal. No respiratory distress.     Breath sounds: Normal breath sounds. No wheezing, rhonchi or rales.  Abdominal:     General: There is no distension.     Palpations: Abdomen is soft.     Tenderness: There is abdominal tenderness (generalized, increased in lower quadrants, right mildly more so than left).  Musculoskeletal:     Cervical back: Neck supple.  Skin:    General: Skin is warm and dry.     Findings: No rash.  Neurological:     Mental Status: He is alert.     Comments: Clear speech.   Psychiatric:        Behavior: Behavior normal.    ED Results / Procedures / Treatments   Labs (all labs ordered are listed, but only abnormal results are displayed) Labs Reviewed  CBC - Abnormal; Notable for the following components:      Result Value   RDW 11.3 (*)    All  other components within normal limits  LIPASE, BLOOD  COMPREHENSIVE METABOLIC PANEL  URINALYSIS, ROUTINE W REFLEX MICROSCOPIC    EKG None  Radiology CT Abdomen Pelvis W Contrast  Result Date: 07/28/2021 CLINICAL DATA:  31 year old male with right lower quadrant abdominal pain. EXAM: CT ABDOMEN AND PELVIS WITH CONTRAST TECHNIQUE: Multidetector CT imaging of the abdomen and pelvis was performed using the standard protocol following bolus administration of intravenous contrast. RADIATION DOSE REDUCTION: This exam was performed according to the departmental dose-optimization program which includes automated exposure control, adjustment of the mA and/or kV according to patient size and/or use of iterative reconstruction technique. CONTRAST:  145mL OMNIPAQUE IOHEXOL 300 MG/ML  SOLN COMPARISON:  CT Abdomen and  Pelvis 04/14/2020. FINDINGS: Lower chest: Negative. Hepatobiliary: Stable liver with tiny subcapsular circumscribed low-density area measuring 5-6 mm on series 2, image 32, most likely a small benign cyst. Negative gallbladder. No bile duct enlargement. Pancreas: Negative. Spleen: Negative. Adrenals/Urinary Tract: Negative. Symmetric renal enhancement. No pararenal inflammation, hydronephrosis, or urinary calculus identified. Stomach/Bowel: Paucity of intra-abdominal fat planes limits bowel detail. No oral contrast. No dilated large or small bowel loops. Retained stool throughout much of the colon. Some flocculated material also in nondilated distal small bowel. Evidence of normal gas within a nondilated appendix medial to the cecum on coronal images 38 through 45. no free air, free fluid, or focal bowel inflammation is identified. Vascular/Lymphatic: Major arterial structures appear patent and normal. Portal venous system, and central venous structures in the pelvis appear to be patent. No lymphadenopathy identified. Reproductive: Negative. Other: No pelvic free fluid. Musculoskeletal: Negative. IMPRESSION: 1. Limited bowel detail due to paucity of intra-abdominal fat, but evidence of a normal appendix medial to the cecum. If clinical suspicion persists a repeat CT Abdomen and Pelvis with both oral and IV contrast in 12-24 hours may be valuable. 2. No acute or inflammatory process identified in the abdomen or pelvis. Electronically Signed   By: Genevie Ann M.D.   On: 07/28/2021 05:49    Procedures Procedures    Medications Ordered in ED Medications - No data to display  ED Course/ Medical Decision Making/ A&P                           Medical Decision Making Amount and/or Complexity of Data Reviewed Labs: ordered. Radiology: ordered.  Risk Prescription drug management.   Patient presents to the ED with complaints of abdominal pain. Patient nontoxic appearing, in no apparent distress, vitals without  significant abnormality. On exam patient tender to the generalized abdomen- more prominent in the lower abdomen more so RLQ, no peritoneal signs.   Additional history obtained:  Additional history obtained from chart review & nursing note review.   Lab Tests:  Labs ordered in triage I have reviewed and interpreted labs, which included:  CBC, CMP, lipase, UA: Unremarkable.   I have ordered analgesics, anti-emetics, and fluids. Will obtain CT for further evaluation.   Imaging Studies ordered:  I ordered imaging studies which included Ct A/P, I independently reviewed, formal radiology impression shows: 1. Limited bowel detail due to paucity of intra-abdominal fat, but evidence of a normal appendix medial to the cecum. If clinical suspicion persists a repeat CT Abdomen and Pelvis with both oral and IV contrast in 12-24 hours may be valuable. 2. No acute or inflammatory process identified in the abdomen or pelvis.  ED Course:  Evidence of normal appendix present, afebrile, no leukocytosis,  repeat abdominal exam  patient remains without peritoneal signs, low suspicion for cholecystitis, pancreatitis, diverticulitis, appendicitis, bowel obstruction/perforation, or other acute surgical process at this time. Patient tolerating PO in the emergency department and feels improved some. Will discharge home with supportive measures. I discussed results, treatment plan, need for PCP follow-up, and return precautions with the patient. Provided opportunity for questions, patient confirmed understanding and is in agreement with plan.   Discussed w/ attending- in agreement.  Portions of this note were generated with [employer]. Dictation errors may occur despite best attempts at proofreading.         Final Clinical Impression(s) / ED Diagnoses Final diagnoses:  Generalized abdominal pain    Rx / DC Orders ED Discharge Orders          Ordered    dicyclomine (BENTYL) 20 MG tablet  Every  8 hours PRN        07/28/21 0601              Amaryllis Dyke, PA-C 07/28/21 [id number]    Quintella Reichert, MD 07/28/21 [id number]

## 2021-09-02 DIAGNOSIS — I10 Essential (primary) hypertension: Secondary | ICD-10-CM | POA: Diagnosis not present

## 2021-09-02 DIAGNOSIS — J351 Hypertrophy of tonsils: Secondary | ICD-10-CM | POA: Insufficient documentation

## 2021-09-02 DIAGNOSIS — Z20822 Contact with and (suspected) exposure to covid-19: Secondary | ICD-10-CM | POA: Insufficient documentation

## 2021-09-02 DIAGNOSIS — R070 Pain in throat: Secondary | ICD-10-CM | POA: Insufficient documentation

## 2021-09-02 DIAGNOSIS — Z79899 Other long term (current) drug therapy: Secondary | ICD-10-CM | POA: Diagnosis not present

## 2021-09-03 ENCOUNTER — Encounter (HOSPITAL_COMMUNITY): Payer: Self-pay

## 2021-09-03 ENCOUNTER — Other Ambulatory Visit: Payer: Self-pay

## 2021-09-03 ENCOUNTER — Emergency Department (HOSPITAL_COMMUNITY)
Admission: EM | Admit: 2021-09-03 | Discharge: 2021-09-03 | Disposition: A | Discharge status: Home / Self Care | Payer: 59 | Attending: Emergency Medicine | Admitting: Emergency Medicine

## 2021-09-03 DIAGNOSIS — J029 Acute pharyngitis, unspecified: Secondary | ICD-10-CM

## 2021-09-03 LAB — RESP PANEL BY RT-PCR (FLU A&B, COVID) ARPGX2
Influenza A by PCR: NEGATIVE
Influenza B by PCR: NEGATIVE
SARS Coronavirus 2 by RT PCR: NEGATIVE

## 2021-09-03 LAB — GROUP A STREP BY PCR: Group A Strep by PCR: DETECTED — AB

## 2021-09-03 MED ORDER — PENICILLIN G BENZATHINE 1200000 UNIT/2ML IM SUSY
1.2000 10*6.[IU] | PREFILLED_SYRINGE | Freq: Once | INTRAMUSCULAR | Status: AC
  Administered 2021-09-03: 1.2 10*6.[IU] via INTRAMUSCULAR
  Filled 2021-09-03: qty 2

## 2021-09-03 NOTE — Discharge Instructions (Signed)
You are treated today empirically for strep throat.  Your symptoms should improve, he can take Tylenol Motrin for any pain that remains.  Follow-up with your primary if symptoms persist.  Return to ED if things worsen

## 2021-09-03 NOTE — ED Triage Notes (Signed)
Pt arrives to ED POV for sore throat since yesterday. Pt states painful to swallow and believes its strep throat due to Hx of same. Redness and irritation noted.

## 2021-09-03 NOTE — ED Provider Notes (Signed)
[facility] EMERGENCY DEPARTMENT Provider Note   CSN: [id number] Arrival date & time: 09/02/21  2348     History  Chief Complaint  Patient presents with   Sore Throat    John Collier is a 31 y.o. male.   Sore Throat   Patient with previous history of hypertension no longer on medicine presents due to sore throat x2 days.  No other associated symptoms, worse when he swallows.  He says he had a fever on the first day, took Tylenol with improvement.  Was seen yesterday at urgent care, tested negative for strep and COVID at that time.  Patient states to be carditis is worsening.   Home Medications Prior to Admission medications   Medication Sig Start Date End Date Taking? Authorizing Provider  dicyclomine (BENTYL) 20 MG tablet Take 1 tablet (20 mg total) by mouth every 8 (eight) hours as needed for spasms. 07/28/21   Petrucelli, Samantha R, PA-C  lisinopril (ZESTRIL) 5 MG tablet Take 1 tablet (5 mg total) by mouth daily. 02/19/21   Drenda Freeze, MD      Allergies    Patient has no known allergies.    Review of Systems   Review of Systems  Physical Exam Updated Vital Signs BP 139/89    Pulse 85    Temp 98.3 F (36.8 C) (Oral)    Resp 16    Ht 5\' 6"  (1.676 m)    Wt 65.8 kg    SpO2 97%    BMI 23.40 kg/m  Physical Exam Vitals and nursing note reviewed. Exam conducted with a chaperone present.  Constitutional:      General: He is not in acute distress.    Appearance: Normal appearance.  HENT:     Head: Normocephalic and atraumatic.     Mouth/Throat:     Mouth: Mucous membranes are moist.     Pharynx: Uvula midline. Posterior oropharyngeal erythema present.     Tonsils: Tonsillar exudate present. 2+ on the right. 2+ on the left.     Comments: Uvula is midline, no trismus.  No sublingual or submandibular swelling Eyes:     General: No scleral icterus.    Extraocular Movements: Extraocular movements intact.     Pupils: Pupils are equal, round, and  reactive to light.  Skin:    Coloration: Skin is not jaundiced.  Neurological:     Mental Status: He is alert. Mental status is at baseline.     Coordination: Coordination normal.    ED Results / Procedures / Treatments   Labs (all labs ordered are listed, but only abnormal results are displayed) Labs Reviewed  GROUP A STREP BY PCR  RESP PANEL BY RT-PCR (FLU A&B, COVID) ARPGX2    EKG None  Radiology No results found.  Procedures Procedures    Medications Ordered in ED Medications  penicillin g benzathine (BICILLIN LA) 1200000 UNIT/2ML injection 1.2 Million Units (has no administration in time range)    ED Course/ Medical Decision Making/ A&P                           Medical Decision Making Risk Prescription drug management.   This patient presents to the ED for concern of sore throat, this involves an extensive number of treatment options, and is a complaint that carries with it a high risk of complications and morbidity.  The differential diagnosis to include: viral vs. strep pharyngitis/  PTA / RPA/ Eula Listen  angina / Sepsis / other   Social determinants of health: Patient health outcomes are affected by his lack of consistent primary care.  Medicines ordered and prescription drug management: -I ordered medication including bicillin  for clinical suspicion for strep throat.   -Reevaluation of the patient after these medicines showed that the patient stayed the same -I have reviewed the patients home medicines and have made adjustments as needed  ED Course: 31 year old male with previous history of hypertension not currently on meds presents due to sore throat.  His vitals are unremarkable, no fever.  On exam his uvula is midline and there is no sign of trismus.  I do not appreciate any peritonsillar abscess, doubt retropharyngeal abscess given no trismus or complaints of neck pain.  He does have bilateral lower tonsillar edema and tonsillar exudate.  I ordered strep  and COVID while in triage, those results are pending at time of discharge.  Given this is his second ER/urgent care visit in the last 2 days for this complaint I think it is more beneficial to treat him empirically.  Do not feel he needs any additional work-up given there were no signs indicating sepsis or more insidious pathology.   Reevaluation: After the interventions noted above, I reevaluated the patient and found that they have :stayed the same   Dispostion: D/C         Final Clinical Impression(s) / ED Diagnoses Final diagnoses:  None    Rx / DC Orders ED Discharge Orders     None         Sherrill Raring, PA-C 09/03/21 [place], [place], DO 09/03/21 [phone]

## 2021-10-17 ENCOUNTER — Encounter (HOSPITAL_COMMUNITY): Payer: Self-pay | Admitting: Emergency Medicine

## 2021-10-17 ENCOUNTER — Other Ambulatory Visit: Payer: Self-pay

## 2021-10-17 ENCOUNTER — Emergency Department (HOSPITAL_COMMUNITY)
Admission: EM | Admit: 2021-10-17 | Discharge: 2021-10-17 | Disposition: A | Discharge status: Home / Self Care | Payer: 59 | Attending: Emergency Medicine | Admitting: Emergency Medicine

## 2021-10-17 DIAGNOSIS — X509XXA Other and unspecified overexertion or strenuous movements or postures, initial encounter: Secondary | ICD-10-CM | POA: Insufficient documentation

## 2021-10-17 DIAGNOSIS — Y9239 Other specified sports and athletic area as the place of occurrence of the external cause: Secondary | ICD-10-CM | POA: Diagnosis not present

## 2021-10-17 DIAGNOSIS — S39012A Strain of muscle, fascia and tendon of lower back, initial encounter: Secondary | ICD-10-CM | POA: Diagnosis not present

## 2021-10-17 DIAGNOSIS — Y9343 Activity, gymnastics: Secondary | ICD-10-CM | POA: Insufficient documentation

## 2021-10-17 DIAGNOSIS — S3992XA Unspecified injury of lower back, initial encounter: Secondary | ICD-10-CM | POA: Diagnosis present

## 2021-10-17 MED ORDER — IBUPROFEN 800 MG PO TABS: 800.0000 mg | ORAL_TABLET | Freq: Three times a day (TID) | ORAL | 0 refills | Status: AC

## 2021-10-17 MED ORDER — METHOCARBAMOL 500 MG PO TABS: 500.0000 mg | ORAL_TABLET | Freq: Two times a day (BID) | ORAL | 0 refills | Status: AC

## 2021-10-17 NOTE — ED Provider Notes (Signed)
?[facility] ?Provider Note ? ? ?CSN: [id number] ?Arrival date & time: 10/17/21  2118 ? ?  ? ?History ? ?Chief Complaint  ?Patient presents with  ? Back Pain  ? ? ?Elvon Courteau is a 31 y.o. male who presents to the ED for evaluation of acute lower back pain that started while he was doing tricep dips at the gym earlier today.  Patient endorses severe pain, worse with walking, bending over or attempting to get up from a standing position.  No other trauma or injury.  Patient denies saddle paresthesia, radicular symptoms, bladder or bowel dysfunction.  He has no other complaints. ? ?Back Pain ? ?  ? ?Home Medications ?Prior to Admission medications   ?Medication Sig Start Date End Date Taking? Authorizing Provider  ?ibuprofen (ADVIL) 800 MG tablet Take 1 tablet (800 mg total) by mouth 3 (three) times daily. 10/17/21  Yes Kathe Becton R, PA-C  ?methocarbamol (ROBAXIN) 500 MG tablet Take 1 tablet (500 mg total) by mouth 2 (two) times daily. 10/17/21  Yes Kathe Becton R, PA-C  ?dicyclomine (BENTYL) 20 MG tablet Take 1 tablet (20 mg total) by mouth every 8 (eight) hours as needed for spasms. 07/28/21   Petrucelli, Samantha R, PA-C  ?lisinopril (ZESTRIL) 5 MG tablet Take 1 tablet (5 mg total) by mouth daily. 02/19/21   Drenda Freeze, MD  ?   ? ?Allergies    ?Patient has no known allergies.   ? ?Review of Systems   ?Review of Systems  ?Musculoskeletal:  Positive for back pain.  ? ?Physical Exam ?Updated Vital Signs ?BP 140/87   Pulse 85   Temp 99.3 ?F (37.4 ?C) (Oral)   Resp 17   Ht 5\' 6"  (1.676 m)   Wt 66.7 kg   SpO2 99%   BMI 23.73 kg/m?  ?Physical Exam ?Vitals and nursing note reviewed.  ?Constitutional:   ?   General: He is not in acute distress. ?   Appearance: He is not ill-appearing.  ?HENT:  ?   Head: Atraumatic.  ?Eyes:  ?   Conjunctiva/sclera: Conjunctivae normal.  ?Cardiovascular:  ?   Rate and Rhythm: Normal rate and regular rhythm.  ?   Pulses: Normal pulses.  ?    Heart sounds: No murmur heard. ?Pulmonary:  ?   Effort: Pulmonary effort is normal. No respiratory distress.  ?   Breath sounds: Normal breath sounds.  ?Abdominal:  ?   General: Abdomen is flat. There is no distension.  ?   Palpations: Abdomen is soft.  ?   Tenderness: There is no abdominal tenderness.  ?Musculoskeletal:     ?   General: Normal range of motion.  ?   Cervical back: Normal range of motion.  ?   Comments: Lumbar paraspinal tenderness to palpation, no midline tenderness  ?Skin: ?   General: Skin is warm and dry.  ?   Capillary Refill: Capillary refill takes less than 2 seconds.  ?Neurological:  ?   General: No focal deficit present.  ?   Mental Status: He is alert.  ?Psychiatric:     ?   Mood and Affect: Mood normal.  ? ? ?ED Results / Procedures / Treatments   ?Labs ?(all labs ordered are listed, but only abnormal results are displayed) ?Labs Reviewed - No data to display ? ?EKG ?None ? ?Radiology ?No results found. ? ?Procedures ?Procedures  ? ? ?Medications Ordered in ED ?Medications - No data to display ? ?ED Course/ Medical  Decision Making/ A&P ?  ?                        ?Medical Decision Making ?Risk ?Prescription drug management. ? ? ?31 year old male in no acute distress, nontoxic-appearing presents the ED for evaluation of lower back injury that occurred at the gym earlier today.  Vitals are normal.  Patient is ambulatory and walking around the room.  He has mild lumbar paraspinal tenderness without midline tenderness.  Given mechanism of injury and clinical symptoms, I do not think that x-ray of the lower back is necessary at this time.  Discussed with patient conservative therapies at home to try including hot pack, Motrin, stretching and walking.  Prescription given for ibuprofen and muscle relaxers.  Return precautions were discussed.  Patient was discharged home in good condition. ?Final Clinical Impression(s) / ED Diagnoses ?Final diagnoses:  ?Strain of lumbar region, initial encounter   ? ? ?Rx / DC Orders ?ED Discharge Orders   ? ?      Ordered  ?  methocarbamol (ROBAXIN) 500 MG tablet  2 times daily       ? 10/17/21 2230  ?  ibuprofen (ADVIL) 800 MG tablet  3 times daily       ? 10/17/21 2230  ? ?  ?  ? ?  ? ? ?  ?Tonye Pearson, [place] ?10/17/21 2250 ? ?  ?Lennice Sites, DO ?10/17/21 2337 ? ?

## 2021-10-17 NOTE — Discharge Instructions (Signed)
Based on the mechanism of your injury and your physical exam, I suspect that your symptoms are from a lumbar strain, or a muscle strain of your lower back.  Unfortunately, this pain can be quite debilitating for a short period of time, but can be treated with ibuprofen, some muscle relaxers and time.  I recommend that you purchase a heating pad that you can put on your back for 20 minutes at a time which may relieve some of the tightness in your back.  I also recommend that you put pillows under your knees to neutralize your spine and prevent further straining while you sleep.  Avoid complete rest as this can delay healing.  Attempt to stretch and walk around plenty which will facilitate faster healing of your back.  I would avoid heavy lifting at the gym until you start to feel better. ?

## 2021-10-17 NOTE — ED Triage Notes (Signed)
Pt c/o lower back pain that started today  

## 2021-10-18 ENCOUNTER — Emergency Department (HOSPITAL_COMMUNITY)
Admission: EM | Admit: 2021-10-18 | Discharge: 2021-10-18 | Disposition: A | Discharge status: Home / Self Care | Payer: 59 | Attending: Emergency Medicine | Admitting: Emergency Medicine

## 2021-10-18 DIAGNOSIS — X500XXA Overexertion from strenuous movement or load, initial encounter: Secondary | ICD-10-CM | POA: Diagnosis not present

## 2021-10-18 DIAGNOSIS — M545 Low back pain, unspecified: Secondary | ICD-10-CM | POA: Insufficient documentation

## 2021-10-18 DIAGNOSIS — Y9343 Activity, gymnastics: Secondary | ICD-10-CM | POA: Diagnosis not present

## 2021-10-18 NOTE — ED Triage Notes (Incomplete)
Pt here for a second evaluation of back pain onset yesterday while working out. Seen for same last night and was given rx for Robaxin and ibuprofen 800mg  but has not been to the pharmacy to pick it up yet and is requesting a XR this time. Pt ambulatory without difficulty.  ?

## 2021-10-18 NOTE — ED Provider Notes (Signed)
?[name] ?Provider Note ? ? ?CSN: [id number] ?Arrival date & time: 10/18/21  1800 ? ?  ? ?History ? ?Chief Complaint  ?Patient presents with  ? Back Pain  ? ? ?John Collier is a 31 y.o. male who presents to the ED today for continued back pain. Pt was seen in the ED yesterday for same - feels like he was doing too much squatting/heavy lifting at the gym. He was prescribed muscle relaxers and Ibuprofen which he has not picked up yet. He states that he was told he did not need an xray yesterday however he is here requesting one today. Denies fall onto the back, weights falling onto back, or any specific trauma to the back. No saddle anesthesia, urinary retention, urinary or bowel incontinence, or any other associated symptoms.  ? ?The history is provided by the patient and medical records.  ? ?  ? ?Home Medications ?Prior to Admission medications   ?Medication Sig Start Date End Date Taking? Authorizing Provider  ?dicyclomine (BENTYL) 20 MG tablet Take 1 tablet (20 mg total) by mouth every 8 (eight) hours as needed for spasms. 07/28/21   Petrucelli, Samantha R, PA-C  ?ibuprofen (ADVIL) 800 MG tablet Take 1 tablet (800 mg total) by mouth 3 (three) times daily. 10/17/21   Tonye Pearson, PA-C  ?lisinopril (ZESTRIL) 5 MG tablet Take 1 tablet (5 mg total) by mouth daily. 02/19/21   Drenda Freeze, MD  ?methocarbamol (ROBAXIN) 500 MG tablet Take 1 tablet (500 mg total) by mouth 2 (two) times daily. 10/17/21   Tonye Pearson, PA-C  ?   ? ?Allergies    ?Patient has no known allergies.   ? ?Review of Systems   ?Review of Systems  ?Constitutional:  Negative for chills and fever.  ?Genitourinary:  Negative for difficulty urinating.  ?Musculoskeletal:  Positive for back pain.  ?All other systems reviewed and are negative. ? ?Physical Exam ?Updated Vital Signs ?BP (!) 158/108   Pulse 85   Temp 98.8 ?F (37.1 ?C) (Oral)   Resp 14   SpO2 100%  ?Physical Exam ?Vitals and nursing note  reviewed.  ?Constitutional:   ?   Appearance: He is not ill-appearing.  ?HENT:  ?   Head: Normocephalic and atraumatic.  ?Eyes:  ?   Conjunctiva/sclera: Conjunctivae normal.  ?Cardiovascular:  ?   Rate and Rhythm: Normal rate and regular rhythm.  ?Pulmonary:  ?   Effort: Pulmonary effort is normal.  ?   Breath sounds: Normal breath sounds.  ?Musculoskeletal:  ?   Comments: + bilateral paralumbar musculature TTP. ROM intact to neck and back. Strength 5/5 to BUE and BLEs. Sensation intact throughout. Non antalgic gait.   ?Skin: ?   General: Skin is warm and dry.  ?   Coloration: Skin is not jaundiced.  ?Neurological:  ?   Mental Status: He is alert.  ? ? ?ED Results / Procedures / Treatments   ?Labs ?(all labs ordered are listed, but only abnormal results are displayed) ?Labs Reviewed - No data to display ? ?EKG ?None ? ?Radiology ?No results found. ? ?Procedures ?Procedures  ? ? ?Medications Ordered in ED ?Medications - No data to display ? ?ED Course/ Medical Decision Making/ A&P ?  ?                        ?Medical Decision Making ?31 year old male who presents to the ED today for further evaluation of back pain.  Was seen yesterday for same and prescribed muscle relaxer and ibuprofen which she has not been taking.  He is here inquiring about needing of an x-ray.  He was evaluated yesterday and it was deemed that he did not require an x-ray given no falls or trauma to the back specifically.  He states that he is here today for same.  On arrival vitals are stable.  Patient appears to be in no acute distress.  I personally visualized patient ambulating from the waiting room without antalgic gait.  He is noted to have bilateral paralumbar musculature tenderness palpation without step-offs or deformities to midline spine.  Again he denies any specific trauma to the back.  States that he believes he may have squatted or down heavy lifting too frequently at the gym.  He has no red flag symptoms today concerning for cauda  equina, spinal epidural abscess, AAA.  I did have lengthy discussion with patient regarding the fact that an x-ray would only show bony abnormality and given he has not had trauma I have very low suspicion for same.  After further discussion he was comfortable going home and trying the medications he was prescribed yesterday as well as rest, ice.  Patient stable for discharge.  ? ?Problems Addressed: ?Acute bilateral low back pain without sciatica: acute illness or injury ? ? ? ? ? ? ? ? ? ?Final Clinical Impression(s) / ED Diagnoses ?Final diagnoses:  ?Acute bilateral low back pain without sciatica  ? ? ?Rx / DC Orders ?ED Discharge Orders   ? ? None  ? ?  ? ? ? ?Discharge Instructions   ? ?  ?Please pick up the medications you were prescribed with yesterday and take as prescribed ? ?You can apply ice to the back to help with inflammation. Transition to heat after 48 hours.  ? ?Follow up with your PCP for further eval ? ?Return to the ED for any new/worsening symptoms ? ? ? ? ?  ?Eustaquio Maize, PA-C ?10/18/21 1846 ? ?  ?Tegeler, Gwenyth Allegra, MD ?10/18/21 2039 ? ?

## 2021-10-18 NOTE — Discharge Instructions (Signed)
Please pick up the medications you were prescribed with yesterday and take as prescribed ? ?You can apply ice to the back to help with inflammation. Transition to heat after 48 hours.  ? ?Follow up with your PCP for further eval ? ?Return to the ED for any new/worsening symptoms ?

## 2022-02-15 ENCOUNTER — Emergency Department (HOSPITAL_COMMUNITY)
Admission: EM | Admit: 2022-02-15 | Discharge: 2022-02-16 | Disposition: A | Discharge status: Home / Self Care | Payer: Commercial Managed Care - HMO | Attending: Emergency Medicine | Admitting: Emergency Medicine

## 2022-02-15 ENCOUNTER — Encounter (HOSPITAL_COMMUNITY): Payer: Self-pay

## 2022-02-15 DIAGNOSIS — R1031 Right lower quadrant pain: Secondary | ICD-10-CM | POA: Insufficient documentation

## 2022-02-15 DIAGNOSIS — R1030 Lower abdominal pain, unspecified: Secondary | ICD-10-CM

## 2022-02-15 DIAGNOSIS — R1032 Left lower quadrant pain: Secondary | ICD-10-CM | POA: Diagnosis not present

## 2022-02-15 DIAGNOSIS — R350 Frequency of micturition: Secondary | ICD-10-CM | POA: Diagnosis not present

## 2022-02-15 LAB — LIPASE, BLOOD: Lipase: 36 U/L (ref 11–51)

## 2022-02-15 LAB — URINALYSIS, ROUTINE W REFLEX MICROSCOPIC
Bilirubin Urine: NEGATIVE
Glucose, UA: NEGATIVE mg/dL
Hgb urine dipstick: NEGATIVE
Ketones, ur: NEGATIVE mg/dL
Leukocytes,Ua: NEGATIVE
Nitrite: NEGATIVE
Protein, ur: NEGATIVE mg/dL
Specific Gravity, Urine: 1.02 (ref 1.005–1.030)
pH: 7 (ref 5.0–8.0)

## 2022-02-15 LAB — CBC WITH DIFFERENTIAL/PLATELET
Abs Immature Granulocytes: 0.01 10*3/uL (ref 0.00–0.07)
Basophils Absolute: 0 10*3/uL (ref 0.0–0.1)
Basophils Relative: 1 %
Eosinophils Absolute: 0 10*3/uL (ref 0.0–0.5)
Eosinophils Relative: 1 %
HCT: 45.1 % (ref 39.0–52.0)
Hemoglobin: 15.4 g/dL (ref 13.0–17.0)
Immature Granulocytes: 0 %
Lymphocytes Relative: 31 %
Lymphs Abs: 1.2 10*3/uL (ref 0.7–4.0)
MCH: 31.1 pg (ref 26.0–34.0)
MCHC: 34.1 g/dL (ref 30.0–36.0)
MCV: 91.1 fL (ref 80.0–100.0)
Monocytes Absolute: 0.4 10*3/uL (ref 0.1–1.0)
Monocytes Relative: 11 %
Neutro Abs: 2.1 10*3/uL (ref 1.7–7.7)
Neutrophils Relative %: 56 %
Platelets: 232 10*3/uL (ref 150–400)
RBC: 4.95 MIL/uL (ref 4.22–5.81)
RDW: 11.4 % — ABNORMAL LOW (ref 11.5–15.5)
WBC: 3.7 10*3/uL — ABNORMAL LOW (ref 4.0–10.5)
nRBC: 0 % (ref 0.0–0.2)

## 2022-02-15 LAB — COMPREHENSIVE METABOLIC PANEL
ALT: 18 U/L (ref 0–44)
AST: 12 U/L — ABNORMAL LOW (ref 15–41)
Albumin: 4.4 g/dL (ref 3.5–5.0)
Alkaline Phosphatase: 42 U/L (ref 38–126)
Anion gap: 4 — ABNORMAL LOW (ref 5–15)
BUN: 12 mg/dL (ref 6–20)
CO2: 26 mmol/L (ref 22–32)
Calcium: 9.7 mg/dL (ref 8.9–10.3)
Chloride: 112 mmol/L — ABNORMAL HIGH (ref 98–111)
Creatinine, Ser: 0.94 mg/dL (ref 0.61–1.24)
GFR, Estimated: >60 mL/min (ref >60)
Glucose, Bld: 93 mg/dL (ref 70–99)
Potassium: 4.2 mmol/L (ref 3.5–5.1)
Sodium: 142 mmol/L (ref 135–145)
Total Bilirubin: 0.5 mg/dL (ref 0.3–1.2)
Total Protein: 7.3 g/dL (ref 6.5–8.1)

## 2022-02-15 LAB — TSH: TSH: 1.921 u[IU]/mL (ref 0.350–4.500)

## 2022-02-15 NOTE — ED Notes (Signed)
Patient reports that he would like to know his blood work results.  Informed patient that he would have to wait until he is seen by a provider for results.  Patient voiced understanding and would like to leave department prior to being seen.  Patient encouraged to wait

## 2022-02-15 NOTE — ED Notes (Signed)
Needs IV for CT  

## 2022-02-15 NOTE — ED Triage Notes (Signed)
Patient reports that he has mid and lower abdominal pain x 3 days. Patient states he feels constipated and bloated despite having a stool today.Patient states he had a BM today that hd "slime-looking stuff" in his stool. Patient denies N/V

## 2022-02-15 NOTE — ED Provider Triage Note (Signed)
Emergency Medicine Provider Triage Evaluation Note  John Collier , a 31 y.o. male  was evaluated in triage.  Pt complains of lower abdominal pain  Review of Systems  Positive: Pain  Negative: fever  Physical Exam  BP (!) 153/92 (BP Location: Left Arm)   Pulse 87   Temp 99 F (37.2 C) (Oral)   Resp 18   Ht 5\' 6"  (1.676 m)   Wt 65.8 kg   SpO2 99%   BMI 23.40 kg/m  Gen:   Awake, no distress   Resp:  Normal effort  MSK:   Moves extremities without difficulty  Other:  Tender lower abdomen  Medical Decision Making  Medically screening exam initiated at 6:46 PM.  Appropriate orders placed.  John Collier was informed that the remainder of the evaluation will be completed by another provider, this initial triage assessment does not replace that evaluation, and the importance of remaining in the ED until their evaluation is complete.     Drucie Ip, Elson Areas 02/15/22 1846

## 2022-02-16 ENCOUNTER — Emergency Department (HOSPITAL_COMMUNITY): Payer: Commercial Managed Care - HMO

## 2022-02-16 MED ORDER — IOHEXOL 300 MG/ML  SOLN
100.0000 mL | Freq: Once | INTRAMUSCULAR | Status: AC | PRN
  Administered 2022-02-16: 100 mL via INTRAVENOUS

## 2022-02-16 MED ORDER — SODIUM CHLORIDE (PF) 0.9 % IJ SOLN
INTRAMUSCULAR | Status: AC
  Filled 2022-02-16: qty 50

## 2022-02-16 NOTE — ED Provider Notes (Signed)
[facility]-EMERGENCY DEPT Provider Note   CSN: [id number] Arrival date & time: 02/15/22  1746     History  Chief Complaint  Patient presents with   Abdominal Pain    John Collier is a 31 y.o. male.  31 year old male presents with complaint of 3 days of lower abdominal pain.  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  Also notes pain as he is riding/driving in the truck.  Denies associated fever, nausea, vomiting.  Patient reports urinary frequency without dysuria or hematuria, no testicular pain.  Reports mucus in his stools today, no blood in stools.  Denies recent URI/congestion no prior abdominal surgeries.  States he had similar symptoms, a few years ago, came to the emergency room without specific diagnosis made.       Home Medications Prior to Admission medications   Medication Sig Start Date End Date Taking? Authorizing Provider  dicyclomine (BENTYL) 20 MG tablet Take 1 tablet (20 mg total) by mouth every 8 (eight) hours as needed for spasms. 07/28/21   Petrucelli, Samantha R, PA-C  ibuprofen (ADVIL) 800 MG tablet Take 1 tablet (800 mg total) by mouth 3 (three) times daily. 10/17/21   Janell Quiet, PA-C  lisinopril (ZESTRIL) 5 MG tablet Take 1 tablet (5 mg total) by mouth daily. 02/19/21   Charlynne Pander, MD  methocarbamol (ROBAXIN) 500 MG tablet Take 1 tablet (500 mg total) by mouth 2 (two) times daily. 10/17/21   Janell Quiet, PA-C      Allergies    Patient has no known allergies.    Review of Systems   Review of Systems Negative except as per HPI Physical Exam Updated Vital Signs BP (!) 148/100   Pulse 78   Temp 98.2 F (36.8 C) (Oral)   Resp 16   Ht 5\' 6"  (1.676 m)   Wt 65.8 kg   SpO2 100%   BMI 23.40 kg/m  Physical Exam Vitals and nursing note reviewed.  Constitutional:      General: He is not in acute distress.    Appearance: He is well-developed. He is not diaphoretic.   HENT:     Head: Normocephalic and atraumatic.  Cardiovascular:     Rate and Rhythm: Normal rate and regular rhythm.     Heart sounds: Normal heart sounds.  Pulmonary:     Effort: Pulmonary effort is normal.     Breath sounds: Normal breath sounds.  Abdominal:     Tenderness: There is abdominal tenderness in the right lower quadrant, suprapubic area and left lower quadrant. There is no right CVA tenderness or left CVA tenderness.  Skin:    General: Skin is warm and dry.     Findings: No erythema or rash.  Neurological:     Mental Status: He is alert and oriented to person, place, and time.  Psychiatric:        Behavior: Behavior normal.     ED Results / Procedures / Treatments   Labs (all labs ordered are listed, but only abnormal results are displayed) Labs Reviewed  CBC WITH DIFFERENTIAL/PLATELET - Abnormal; Notable for the following components:      Result Value   WBC 3.7 (*)    RDW 11.4 (*)    All other components within normal limits  COMPREHENSIVE METABOLIC PANEL - Abnormal; Notable for the following components:   Chloride 112 (*)    AST 12 (*)    Anion  gap 4 (*)    All other components within normal limits  LIPASE, BLOOD  URINALYSIS, ROUTINE W REFLEX MICROSCOPIC  TSH    EKG None  Radiology CT ABDOMEN PELVIS W CONTRAST  Result Date: 02/16/2022 CLINICAL DATA:  Abdominal pain, acute, nonlocalized EXAM: CT ABDOMEN AND PELVIS WITH CONTRAST TECHNIQUE: Multidetector CT imaging of the abdomen and pelvis was performed using the standard protocol following bolus administration of intravenous contrast. RADIATION DOSE REDUCTION: This exam was performed according to the departmental dose-optimization program which includes automated exposure control, adjustment of the mA and/or kV according to patient size and/or use of iterative reconstruction technique. CONTRAST:  OMNIPAQUE IOHEXOL 300 MG/ML  SOLN COMPARISON:  07/28/2021 FINDINGS: Lower chest: No acute abnormality.  Hepatobiliary: Stable probable subserosal cyst or hemangioma within the right hepatic lobe. Liver otherwise unremarkable. Gallbladder unremarkable. No intra or extrahepatic biliary ductal dilation. Pancreas: Unremarkable Spleen: Unremarkable Adrenals/Urinary Tract: Adrenal glands are unremarkable. Kidneys are normal, without renal calculi, focal lesion, or hydronephrosis. Bladder is unremarkable. Stomach/Bowel: Stomach is within normal limits. Appendix appears normal. No evidence of bowel wall thickening, distention, or inflammatory changes. Vascular/Lymphatic: No significant vascular findings are present. No enlarged abdominal or pelvic lymph nodes. Reproductive: Prostate is unremarkable. Other: No abdominal wall hernia or abnormality. No abdominopelvic ascites. Musculoskeletal: No acute or significant osseous findings. IMPRESSION: No acute intra-abdominal pathology identified. No definite radiographic explanation for the patient's reported symptoms. Electronically Signed   By: Helyn Numbers M.D.   On: 02/16/2022 01:28    Procedures Procedures    Medications Ordered in ED Medications  sodium chloride (PF) 0.9 % injection (has no administration in time range)  iohexol (OMNIPAQUE) 300 MG/ML solution 100 mL (100 mLs Intravenous Contrast Given 02/16/22 0100)    ED Course/ Medical Decision Making/ A&P                           Medical Decision Making  This patient presents to the ED for concern of lower abdominal pain, this involves an extensive number of treatment options, and is a complaint that carries with it a high risk of complications and morbidity.  The differential diagnosis includes but not limited to muscle strain, hernia, torsion, urinary tract infection, appendicitis, diverticulitis   Co morbidities that complicate the patient evaluation  Hypertension   Additional history obtained:  External records from outside source obtained and reviewed including CT and pelvis from July 28, 2021 without acute process identified   Lab Tests:  I Ordered, and personally interpreted labs.  The pertinent results include: Labs reassuring including CBC, CMP, urinalysis, lipase, TSH   Imaging Studies ordered:  I ordered imaging studies including CT abdomen pelvis with contrast I independently visualized and interpreted imaging which showed no acute process. I agree with the radiologist interpretation   Problem List / ED Course / Critical interventions / Medication management  31 year old male with complaint of lower abdominal pain as above.  Found to have mild lower abdominal discomfort on exam without CVA tenderness.  Denies testicular pain.  Workup today reassuring including labs and contrast CT abdomen pelvis.  Offered pain medication in the ER, patient declines.  Advised to follow-up with GI if he continues to have abdominal pain and mucus-like stool.  Return to ER for worsening or concerning symptoms.  Can take Motrin and Tylenol as instructed for his pain, consider possible muscle strain as he does a lot of lifting. I have reviewed the patients home medicines  and have made adjustments as needed   Social Determinants of Health:  No PCP, advised to reference his paperwork for assistance obtaining care   Test / Admission - Considered:  Consider further evaluation with testicular ultrasound testicular exam, deferred         Final Clinical Impression(s) / ED Diagnoses Final diagnoses:  Lower abdominal pain    Rx / DC Orders ED Discharge Orders     None         Alden Hipp 02/16/22 0430    Palumbo, April, MD 02/16/22 0630

## 2022-02-16 NOTE — ED Notes (Signed)
Patient taken to CT at this time.

## 2022-02-16 NOTE — Discharge Instructions (Signed)
Follow-up with gastroenterology if symptoms persist.  Return to the emergency room for worsening or concerning symptoms.

## 2022-06-18 ENCOUNTER — Emergency Department (HOSPITAL_COMMUNITY)
Admission: EM | Admit: 2022-06-18 | Discharge: 2022-06-19 | Discharge status: Against Medical Advice | Payer: Commercial Managed Care - HMO | Attending: Student | Admitting: Student

## 2022-06-18 ENCOUNTER — Other Ambulatory Visit: Payer: Self-pay

## 2022-06-18 DIAGNOSIS — R109 Unspecified abdominal pain: Secondary | ICD-10-CM | POA: Diagnosis present

## 2022-06-18 DIAGNOSIS — Z5321 Procedure and treatment not carried out due to patient leaving prior to being seen by health care provider: Secondary | ICD-10-CM | POA: Insufficient documentation

## 2022-06-18 LAB — LIPASE, BLOOD: Lipase: 50 U/L (ref 11–51)

## 2022-06-18 LAB — CBC WITH DIFFERENTIAL/PLATELET
Abs Immature Granulocytes: 0.01 10*3/uL (ref 0.00–0.07)
Basophils Absolute: 0 10*3/uL (ref 0.0–0.1)
Basophils Relative: 1 %
Eosinophils Absolute: 0 10*3/uL (ref 0.0–0.5)
Eosinophils Relative: 1 %
HCT: 45.4 % (ref 39.0–52.0)
Hemoglobin: 15.2 g/dL (ref 13.0–17.0)
Immature Granulocytes: 0 %
Lymphocytes Relative: 41 %
Lymphs Abs: 1.7 10*3/uL (ref 0.7–4.0)
MCH: 30.6 pg (ref 26.0–34.0)
MCHC: 33.5 g/dL (ref 30.0–36.0)
MCV: 91.5 fL (ref 80.0–100.0)
Monocytes Absolute: 0.4 10*3/uL (ref 0.1–1.0)
Monocytes Relative: 10 %
Neutro Abs: 2 10*3/uL (ref 1.7–7.7)
Neutrophils Relative %: 47 %
Platelets: 210 10*3/uL (ref 150–400)
RBC: 4.96 MIL/uL (ref 4.22–5.81)
RDW: 11.2 % — ABNORMAL LOW (ref 11.5–15.5)
WBC: 4.1 10*3/uL (ref 4.0–10.5)
nRBC: 0 % (ref 0.0–0.2)

## 2022-06-18 LAB — COMPREHENSIVE METABOLIC PANEL
ALT: 21 U/L (ref 0–44)
AST: 15 U/L (ref 15–41)
Albumin: 4.2 g/dL (ref 3.5–5.0)
Alkaline Phosphatase: 41 U/L (ref 38–126)
Anion gap: 4 — ABNORMAL LOW (ref 5–15)
BUN: 12 mg/dL (ref 6–20)
CO2: 29 mmol/L (ref 22–32)
Calcium: 9.3 mg/dL (ref 8.9–10.3)
Chloride: 107 mmol/L (ref 98–111)
Creatinine, Ser: 0.85 mg/dL (ref 0.61–1.24)
GFR, Estimated: >60 mL/min (ref >60)
Glucose, Bld: 90 mg/dL (ref 70–99)
Potassium: 4 mmol/L (ref 3.5–5.1)
Sodium: 140 mmol/L (ref 135–145)
Total Bilirubin: 0.6 mg/dL (ref 0.3–1.2)
Total Protein: 7.2 g/dL (ref 6.5–8.1)

## 2022-06-18 LAB — TSH: TSH: 1.767 u[IU]/mL (ref 0.350–4.500)

## 2022-06-18 NOTE — ED Triage Notes (Signed)
Patient coming to ED for evaluation of sore throat and abdominal pain.  Reports he is concerned for "leaky gut and thyroid problems."  No hx of thyroid issues.  States diary and eggs worsen his abdominal pain.

## 2022-06-18 NOTE — ED Provider Triage Note (Signed)
Emergency Medicine Provider Triage Evaluation Note  John Collier , a 31 y.o. male  was evaluated in triage.  Pt complains of multiple abdominal complaints.  Patient states he has been googling his symptoms and believes he may have leaky gut.  He states he has sensitivity to dairy, eggs, bread.  He states he has been intermittently constipated.  He states he feels that his thyroid may be having an issue because he is more tired than usual.  He complains of tenderness in the neck but when asked where he is tender it is to the bilateral parts of his neck near his clavicles.  He denies any difficulty swallowing, denies hot or cold sensitivities, denies palpitations, shortness of breath, chest pain.  Endorses vague abdominal pain, constipation, sensitivities to foods.  Patient states that he drinks approximately 216 ounce bottles of water daily and endorses not eating much fiber.  Review of Systems  Positive: As above Negative: As above  Physical Exam  BP (!) 155/89 (BP Location: Left Arm)   Pulse 83   Temp 98.7 F (37.1 C) (Oral)   Resp 16   Ht 5\' 6"  (1.676 m)   Wt 66.7 kg   SpO2 100%   BMI 23.73 kg/m  Gen:   Awake, no distress   Resp:  Normal effort  MSK:   Moves extremities without difficulty  Other:  No focal tenderness on abdominal examination  Medical Decision Making  Medically screening exam initiated at 10:42 PM.  Appropriate orders placed.  Zyhir Cappella was informed that the remainder of the evaluation will be completed by another provider, this initial triage assessment does not replace that evaluation, and the importance of remaining in the ED until their evaluation is complete.     Drucie Ip, PA-C 06/18/22 2244

## 2022-06-18 NOTE — ED Notes (Signed)
Patient has a gold top in the main lab 

## 2022-06-19 LAB — T4, FREE: Free T4: 0.87 ng/dL (ref 0.61–1.12)

## 2022-06-19 NOTE — ED Notes (Signed)
Patient called from lobby x 3 for room placement.  No answer x 3

## 2022-06-20 LAB — T3, FREE: T3, Free: 3.5 pg/mL (ref 2.0–4.4)

## 2022-07-27 DIAGNOSIS — R140 Abdominal distension (gaseous): Secondary | ICD-10-CM | POA: Diagnosis not present

## 2022-07-27 DIAGNOSIS — J392 Other diseases of pharynx: Secondary | ICD-10-CM | POA: Insufficient documentation

## 2022-07-27 DIAGNOSIS — Z5321 Procedure and treatment not carried out due to patient leaving prior to being seen by health care provider: Secondary | ICD-10-CM | POA: Diagnosis not present

## 2022-07-28 ENCOUNTER — Encounter (HOSPITAL_COMMUNITY): Payer: Self-pay

## 2022-07-28 ENCOUNTER — Emergency Department (HOSPITAL_COMMUNITY)
Admission: EM | Admit: 2022-07-28 | Discharge: 2022-07-28 | Discharge status: Against Medical Advice | Payer: BLUE CROSS/BLUE SHIELD | Attending: Emergency Medicine | Admitting: Emergency Medicine

## 2022-07-28 ENCOUNTER — Emergency Department (HOSPITAL_COMMUNITY)
Admission: EM | Admit: 2022-07-28 | Discharge: 2022-07-28 | Discharge status: Against Medical Advice | Payer: BLUE CROSS/BLUE SHIELD | Source: Home / Self Care

## 2022-07-28 ENCOUNTER — Other Ambulatory Visit: Payer: Self-pay

## 2022-07-28 DIAGNOSIS — Z5321 Procedure and treatment not carried out due to patient leaving prior to being seen by health care provider: Secondary | ICD-10-CM | POA: Insufficient documentation

## 2022-07-28 DIAGNOSIS — R140 Abdominal distension (gaseous): Secondary | ICD-10-CM | POA: Insufficient documentation

## 2022-07-28 LAB — CBC WITH DIFFERENTIAL/PLATELET
Abs Immature Granulocytes: 0.01 10*3/uL (ref 0.00–0.07)
Basophils Absolute: 0 10*3/uL (ref 0.0–0.1)
Basophils Relative: 1 %
Eosinophils Absolute: 0.1 10*3/uL (ref 0.0–0.5)
Eosinophils Relative: 1 %
HCT: 45.7 % (ref 39.0–52.0)
Hemoglobin: 15.3 g/dL (ref 13.0–17.0)
Immature Granulocytes: 0 %
Lymphocytes Relative: 35 %
Lymphs Abs: 1.8 10*3/uL (ref 0.7–4.0)
MCH: 30.4 pg (ref 26.0–34.0)
MCHC: 33.5 g/dL (ref 30.0–36.0)
MCV: 90.9 fL (ref 80.0–100.0)
Monocytes Absolute: 0.4 10*3/uL (ref 0.1–1.0)
Monocytes Relative: 8 %
Neutro Abs: 2.7 10*3/uL (ref 1.7–7.7)
Neutrophils Relative %: 55 %
Platelets: 274 10*3/uL (ref 150–400)
RBC: 5.03 MIL/uL (ref 4.22–5.81)
RDW: 11.6 % (ref 11.5–15.5)
WBC: 5 10*3/uL (ref 4.0–10.5)
nRBC: 0 % (ref 0.0–0.2)

## 2022-07-28 LAB — COMPREHENSIVE METABOLIC PANEL
ALT: 23 U/L (ref 0–44)
AST: 15 U/L (ref 15–41)
Albumin: 4.3 g/dL (ref 3.5–5.0)
Alkaline Phosphatase: 45 U/L (ref 38–126)
Anion gap: 8 (ref 5–15)
BUN: 14 mg/dL (ref 6–20)
CO2: 26 mmol/L (ref 22–32)
Calcium: 9.4 mg/dL (ref 8.9–10.3)
Chloride: 102 mmol/L (ref 98–111)
Creatinine, Ser: 0.97 mg/dL (ref 0.61–1.24)
GFR, Estimated: >60 mL/min (ref >60)
Glucose, Bld: 85 mg/dL (ref 70–99)
Potassium: 4.4 mmol/L (ref 3.5–5.1)
Sodium: 136 mmol/L (ref 135–145)
Total Bilirubin: 0.7 mg/dL (ref 0.3–1.2)
Total Protein: 7.4 g/dL (ref 6.5–8.1)

## 2022-07-28 LAB — LIPASE, BLOOD: Lipase: 49 U/L (ref 11–51)

## 2022-07-28 NOTE — ED Triage Notes (Signed)
Abdomen pain constant since last week. Started taking a new supplement creatine. Pt reports since then his "thyroid  has been hurting." Pt reports this happening before in December. Bloating, constipation, weight loss.   Flu about 2 weeks ago. Pt reports cough that hasn't gone away.

## 2022-07-28 NOTE — ED Triage Notes (Addendum)
Subjective swelling and "a crackling / popping" feeling when pressing on throat x 2 days.   Concern for thyroid issues also since he has been losing weight. Says he was evaluated a year ago but everything came back fine.   Recently stopped taking creatine and other weight gain supplements. Requesting labs from his chart that were ordered in December encounter.

## 2022-07-28 NOTE — ED Provider Triage Note (Signed)
Emergency Medicine Provider Triage Evaluation Note  John Collier , a 32 y.o. male  was evaluated in triage.  Pt complains of abdominal bloating for the past couple of weeks since starting a new protein powder.  He states he had this once before and came to the ER, states everything was normal at that time but the symptoms are little bit worse.  He is having normal bowel movements and states that his bloating seems to improve and he is less gas after a bowel movement but after easily seem to return..  Review of Systems  Positive: Abdominal bloating Negative: Area, vomiting, fevers, urinary symptoms  Physical Exam  BP (!) 153/98   Pulse 72   Temp 98.4 F (36.9 C) (Oral)   Resp 16   Ht 5\' 6"  (1.676 m)   Wt 66.7 kg   SpO2 99%   BMI 23.73 kg/m  Gen:   Awake, no distress   Resp:  Normal effort  MSK:   Moves extremities without difficulty  Other:    Medical Decision Making  Medically screening exam initiated at 9:32 PM.  Appropriate orders placed.  John Collier was informed that the remainder of the evaluation will be completed by another provider, this initial triage assessment does not replace that evaluation, and the importance of remaining in the ED until their evaluation is complete.     Gwenevere Abbot, [place] 07/28/22 2133

## 2022-07-28 NOTE — ED Notes (Signed)
Labeled urine specimen sent to lab. [facility] ?

## 2023-03-30 ENCOUNTER — Emergency Department (HOSPITAL_COMMUNITY)
Admission: EM | Admit: 2023-03-30 | Discharge: 2023-03-30 | Disposition: A | Discharge status: Home / Self Care | Payer: BLUE CROSS/BLUE SHIELD | Attending: Emergency Medicine | Admitting: Emergency Medicine

## 2023-03-30 ENCOUNTER — Emergency Department (HOSPITAL_COMMUNITY): Payer: BLUE CROSS/BLUE SHIELD

## 2023-03-30 DIAGNOSIS — R350 Frequency of micturition: Secondary | ICD-10-CM | POA: Insufficient documentation

## 2023-03-30 DIAGNOSIS — R634 Abnormal weight loss: Secondary | ICD-10-CM | POA: Insufficient documentation

## 2023-03-30 DIAGNOSIS — S39012A Strain of muscle, fascia and tendon of lower back, initial encounter: Secondary | ICD-10-CM | POA: Diagnosis not present

## 2023-03-30 DIAGNOSIS — Z79899 Other long term (current) drug therapy: Secondary | ICD-10-CM | POA: Insufficient documentation

## 2023-03-30 DIAGNOSIS — S3992XA Unspecified injury of lower back, initial encounter: Secondary | ICD-10-CM | POA: Diagnosis present

## 2023-03-30 DIAGNOSIS — I10 Essential (primary) hypertension: Secondary | ICD-10-CM | POA: Diagnosis not present

## 2023-03-30 DIAGNOSIS — X500XXA Overexertion from strenuous movement or load, initial encounter: Secondary | ICD-10-CM | POA: Insufficient documentation

## 2023-03-30 DIAGNOSIS — Y990 Civilian activity done for income or pay: Secondary | ICD-10-CM | POA: Insufficient documentation

## 2023-03-30 LAB — URINALYSIS, ROUTINE W REFLEX MICROSCOPIC
Bilirubin Urine: NEGATIVE
Glucose, UA: NEGATIVE mg/dL
Hgb urine dipstick: NEGATIVE
Ketones, ur: NEGATIVE mg/dL
Leukocytes,Ua: NEGATIVE
Nitrite: NEGATIVE
Protein, ur: NEGATIVE mg/dL
Specific Gravity, Urine: 1.02 (ref 1.005–1.030)
pH: 5 (ref 5.0–8.0)

## 2023-03-30 LAB — CBG MONITORING, ED: Glucose-Capillary: 85 mg/dL (ref 70–99)

## 2023-03-30 MED ORDER — IBUPROFEN 800 MG PO TABS
800.0000 mg | ORAL_TABLET | Freq: Once | ORAL | Status: AC
  Administered 2023-03-30: 800 mg via ORAL
  Filled 2023-03-30: qty 1

## 2023-03-30 NOTE — ED Provider Notes (Addendum)
[place] EMERGENCY DEPARTMENT AT [facility] Provider Note   CSN: [id number] Arrival date & time: 03/30/23  1438     History  Chief Complaint  Patient presents with   Back Pain    John Collier is a 32 y.o. male with history of hypertension who presents the emergency department complaining of right lower back pain.  Started 4 days ago after he lifted something heavy at work.  Pain initially had improved, but returned yesterday.  He has not taken anything for it.  States that it radiates into his right upper thigh.  He was doing some research and is worried about a pulled muscle or a slipped disc.  Patient was also asking if he can have labs to evaluate some vague symptoms he is been having for a few months.  He is complaining of frequent urination, weight loss, and is worried about his kidney and liver function. Does not have a PCP.    Back Pain      Home Medications Prior to Admission medications   Medication Sig Start Date End Date Taking? Authorizing Provider  dicyclomine (BENTYL) 20 MG tablet Take 1 tablet (20 mg total) by mouth every 8 (eight) hours as needed for spasms. 07/28/21   Petrucelli, Samantha R, PA-C  ibuprofen (ADVIL) 800 MG tablet Take 1 tablet (800 mg total) by mouth 3 (three) times daily. 10/17/21   Janell Quiet, PA-C  lisinopril (ZESTRIL) 5 MG tablet Take 1 tablet (5 mg total) by mouth daily. 02/19/21   Charlynne Pander, MD  methocarbamol (ROBAXIN) 500 MG tablet Take 1 tablet (500 mg total) by mouth 2 (two) times daily. 10/17/21   Janell Quiet, PA-C      Allergies    Patient has no known allergies.    Review of Systems   Review of Systems  Constitutional:  Positive for unexpected weight change.  Genitourinary:  Positive for frequency.  Musculoskeletal:  Positive for back pain.  All other systems reviewed and are negative.   Physical Exam Updated Vital Signs BP (!) 149/78   Pulse 81   Temp 98.3 F (36.8 C)   Resp 17   Ht 5\' 6"   (1.676 m)   Wt 64.2 kg   SpO2 98%   BMI 22.85 kg/m  Physical Exam Vitals and nursing note reviewed.  Constitutional:      Appearance: Normal appearance.  HENT:     Head: Normocephalic and atraumatic.  Eyes:     Conjunctiva/sclera: Conjunctivae normal.  Pulmonary:     Effort: Pulmonary effort is normal. No respiratory distress.  Musculoskeletal:     Comments: Full passive ROM of all regions of spine.  R lower lumbar muscular tenderness to palpation. No midline spinal tenderness, step-offs or crepitus.  Strength 5/5 in all extremities.  Sensation intact in all extremities.  Skin:    General: Skin is warm and dry.  Neurological:     Mental Status: He is alert.  Psychiatric:        Mood and Affect: Mood normal.        Behavior: Behavior normal.     ED Results / Procedures / Treatments   Labs (all labs ordered are listed, but only abnormal results are displayed) Labs Reviewed  URINALYSIS, ROUTINE W REFLEX MICROSCOPIC  CBG MONITORING, ED    EKG None  Radiology DG Lumbar Spine Complete  Result Date: 03/30/2023 CLINICAL DATA:  low back pain after injury. EXAM: LUMBAR SPINE - COMPLETE 4+ VIEW COMPARISON:  None  Available. FINDINGS: There are 5 nonrib-bearing lumbar vertebrae. There is mild loss of lumbar lordosis, which may be on the basis of positioning or due to muscle spasm. No spondylolysis or spondylolisthesis. Vertebral body heights are maintained. No aggressive osseous lesion. Intervertebral disc heights are maintained. No significant facet arthropathy or marginal osteophyte formation. Sacroiliac joints are symmetric. Visualized soft tissues are within normal limits. IMPRESSION: 1. Mild loss of lumbar lordosis, which may be on the basis of positioning or due to muscle spasm. 2. No acute osseous abnormality of the lumbar spine. No significant degenerative changes. Electronically Signed   By: Jules Schick M.D.   On: 03/30/2023 16:20    Procedures Procedures    Medications  Ordered in ED Medications  ibuprofen (ADVIL) tablet 800 mg (800 mg Oral Given 03/30/23 2100)    ED Course/ Medical Decision Making/ A&P                                 Medical Decision Making Amount and/or Complexity of Data Reviewed Labs: ordered. Radiology: ordered.  Risk Prescription drug management.  This patient is a 32 y.o. male who presents to the ED for concern of lower back pain x 4 days.   Differential diagnoses prior to evaluation: Fracture (acute/chronic), muscle strain, cauda equina, spinal stenosis, DDD, ligamentous injury, disk herniation, metastatic cancer, vertebral osteomyelitis, kidney stone, pyelonephritis, AAA, pancreatitis, bowel obstruction, meningitis.  Past Medical History / Social History / Additional history: Chart reviewed. Pertinent results include: HTN  Reviewed prior labs from ER visit in January 2024, all unremarkable  Physical Exam: Physical exam performed. The pertinent findings include: Mildly hypertensive, has normal vital signs.  No midline spinal tenderness, step-offs or crepitus.  Right lower lumbar muscular tenderness to palpation.  Neurovascularly intact in all extremities.  Imaging: Lumbar spine x-ray with mild loss of lumbar lordosis, no acute abnormalities  Labs: Normal CBG.  Urinalysis without hematuria or infection.  Medications / Treatment: Given Motrin   Disposition: After consideration of the diagnostic results and the patients response to treatment, I feel that emergency department workup does not suggest an emergent condition requiring admission or immediate intervention beyond what has been performed at this time. Patient with back pain.  No neurological deficits and normal neuro exam.  Patient can walk but states is painful.  No loss of bowel or bladder control.  No concern for cauda equina.  No fever, night sweats, weight loss, h/o cancer, IVDU.  The plan is: Discharge to home with symptomatic management of likely lumbar  strain.  Recommend NSAIDs, rest, and follow-up with orthopedics if symptoms do not improve.. The patient is safe for discharge and has been instructed to return immediately for worsening symptoms, change in symptoms or any other concerns.  Commend patient establish with PCP to follow-up on specific lab test that he is inquiring about.  Given follow-up resources for [facility] and wellness.  Final Clinical Impression(s) / ED Diagnoses Final diagnoses:  Strain of lumbar region, initial encounter    Rx / DC Orders ED Discharge Orders     None      Portions of this report may have been transcribed using voice recognition software. Every effort was made to ensure accuracy; however, inadvertent computerized transcription errors may be present.    Leronda Lewers T, PA-C 03/30/23 2308    Shubham Thackston T, PA-C 03/30/23 2309    Lonell Grandchild, MD 04/04/23 [phone]

## 2023-03-30 NOTE — Discharge Instructions (Addendum)
You were seen in the emergency department today for back pain.   Your blood sugar, urine sample, and x-ray all looked reassuring today.   I suspect this is likely caused by a strain of the muscles of your lower back.  Rest, gentle exercise and stretching will aid in recovery from any injuries to your back.  Using medication such as Tylenol and ibuprofen will help alleviate pain as well as decrease swelling and inflammation associated with these injuries. You may use 600 mg ibuprofen every 6 hours or 1000 mg of Tylenol every 6 hours.  You may choose to alternate between the 2.  This would be most effective.  Not to exceed 4 g of Tylenol within 24 hours.  Not to exceed 3200 mg ibuprofen 24 hours.  Salt water/Epson salt soaks, massage, icy hot/Biofreeze/BenGay and other similar products can help with symptoms.  Please return to the emergency department for reevaluation if you denies any new or concerning symptoms such as fever, new numbness or weakness in your legs, difficulty using the restroom or incontinence.   If it continues to bother you, I recommend following up with an orthopedist.  If you do not have a primary care provider, you may reach out to [facility] at [phone] to establish with one and make your first appointment.

## 2023-03-30 NOTE — ED Provider Triage Note (Signed)
Emergency Medicine Provider Triage Evaluation Note  John Collier , a 31 y.o. male  was evaluated in triage.  Pt complains of R lower back pain after lifting something heavy at work x 4 days. Pain initially improved, but returned yesterday. Has not taken anything for it. Pain radiates into the right thigh. He was worried about a pulled muscle or slipped disc.   Review of Systems  Positive: Back pain Negative: Numbness, weakness, urinary sx  Physical Exam  BP (!) 142/89 (BP Location: Left Arm)   Pulse 88   Temp 98.3 F (36.8 C) (Oral)   Resp 16   Ht 5\' 6"  (1.676 m)   Wt 64.2 kg   SpO2 99%   BMI 22.85 kg/m  Gen:   Awake, no distress   Resp:  Normal effort  MSK:   Moves extremities without difficulty  Other:    Medical Decision Making  Medically screening exam initiated at 3:34 PM.  Appropriate orders placed.  John Collier was informed that the remainder of the evaluation will be completed by another provider, this initial triage assessment does not replace that evaluation, and the importance of remaining in the ED until their evaluation is complete.  Was also complainng of frequent urination and vague symptoms for several months, is interested in blood testing for kidney and liver function and to test for diabetes. He had normal lab testing at ER visit back in January. Explained we will get CBG and UA but recommended he follow up with PCP regarding ongoing symptoms if not addressed during today's ER visit   Su Monks, PA-C 03/30/23 1541

## 2023-03-30 NOTE — ED Triage Notes (Signed)
Patient here from home reporting lower back pain radiating down right leg. States that he was lifting boxes at work all week.

## 2023-05-13 IMAGING — CT CT ABD-PELV W/ CM
2 of 4 series · 15 of 46 positions shown, 17 images · IV contrast (OMNIPAQUE 300)
Comparison: CT Abdomen and Pelvis 04/14/2020.

CLINICAL DATA: 30-year-old male with right lower quadrant abdominal
pain.

EXAM:
CT ABDOMEN AND PELVIS WITH CONTRAST
TECHNIQUE: Multidetector CT imaging of the abdomen and pelvis was performed
using the standard protocol following bolus administration of
intravenous contrast.

[Series 2: axial st · axial · 0.68mm/px · z∈[+1036,+1406]mm · 12 of 84 slices shown, 14 images]
[im 5/84  soft-tissue]
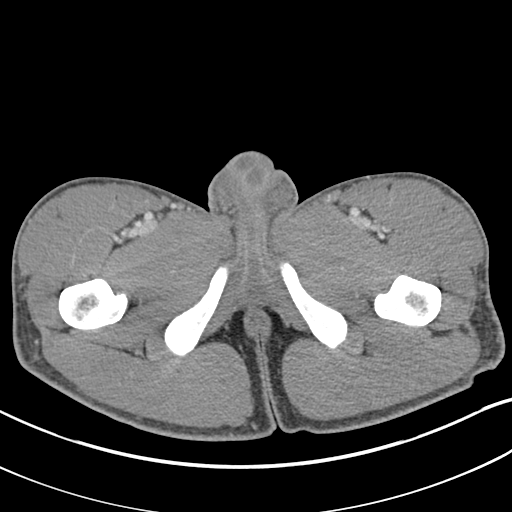
[im 5/84  bone]
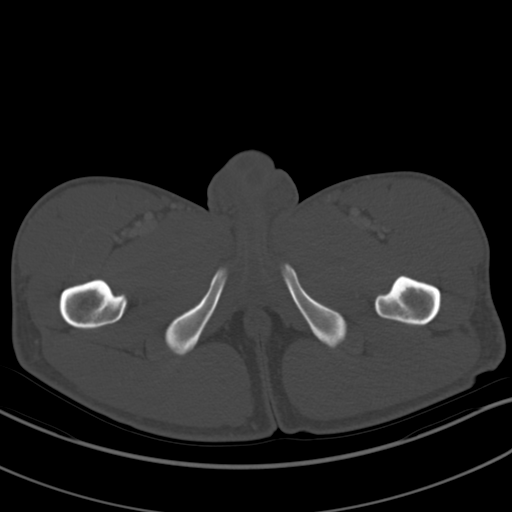
[im 13/84  soft-tissue]
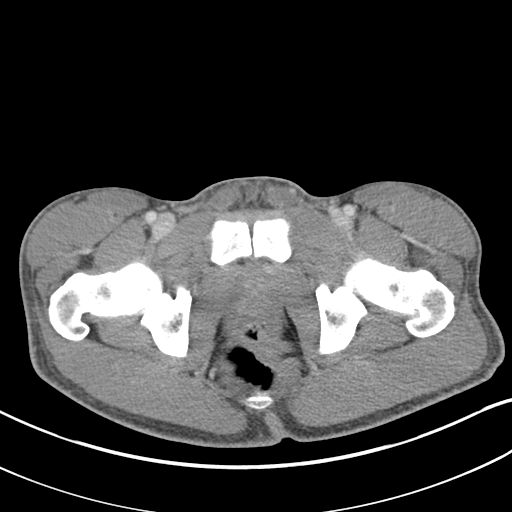
[im 17/84  soft-tissue]
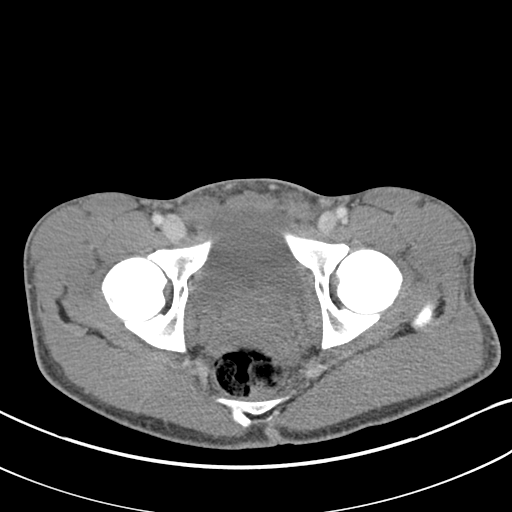
[im 25/84  soft-tissue]
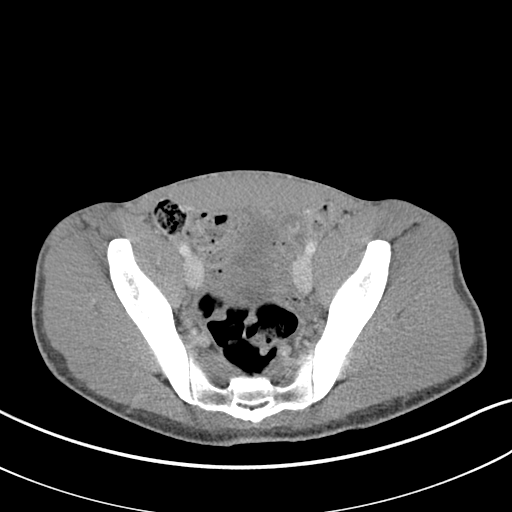
[im 34/84  soft-tissue]
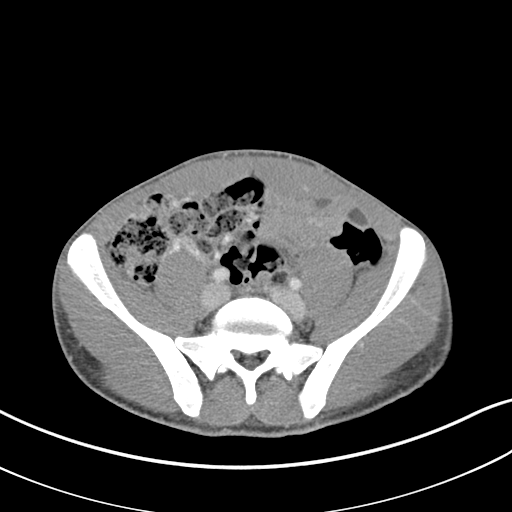
[im 38/84  soft-tissue]
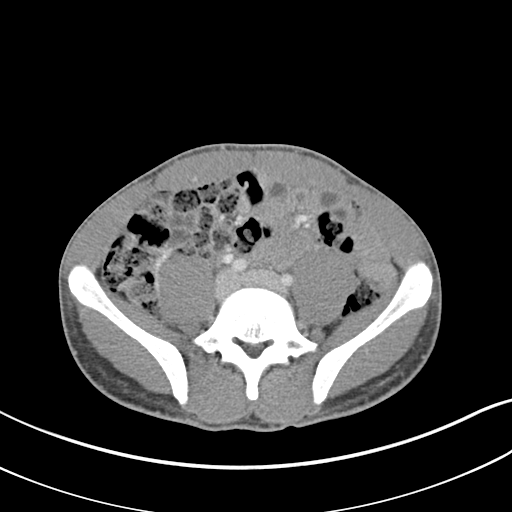
[im 46/84  soft-tissue]
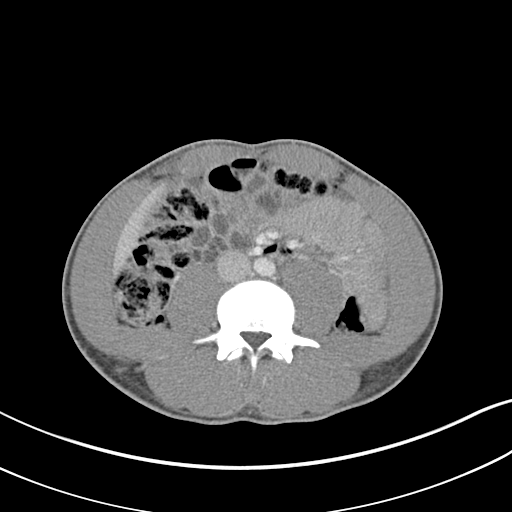
[im 50/84  soft-tissue]
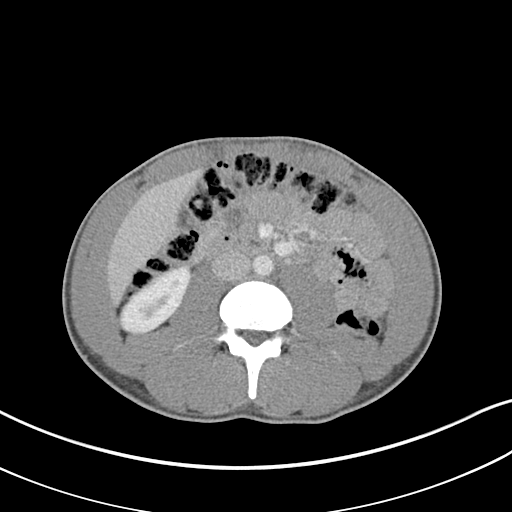
[im 59/84  soft-tissue]
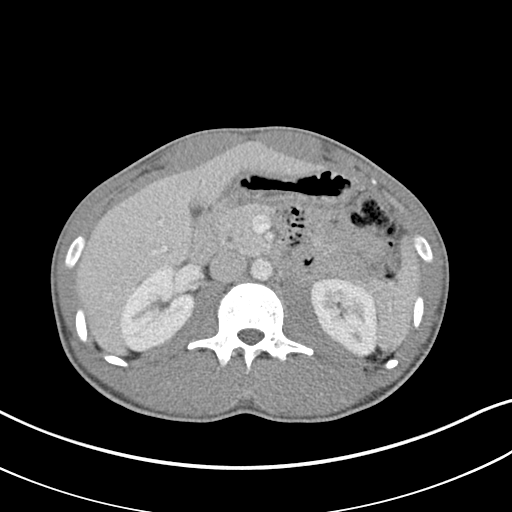
[im 59/84  bone]
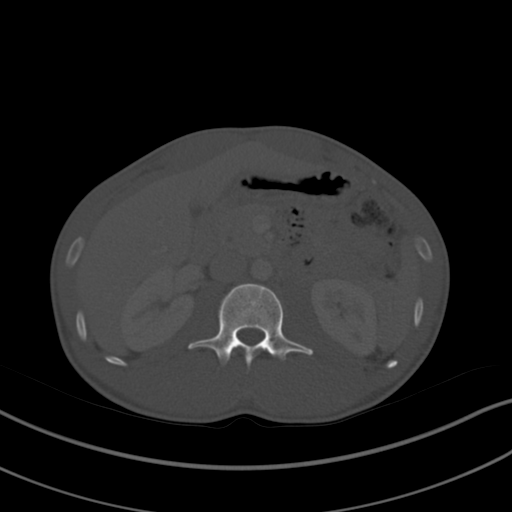
[im 67/84  soft-tissue]
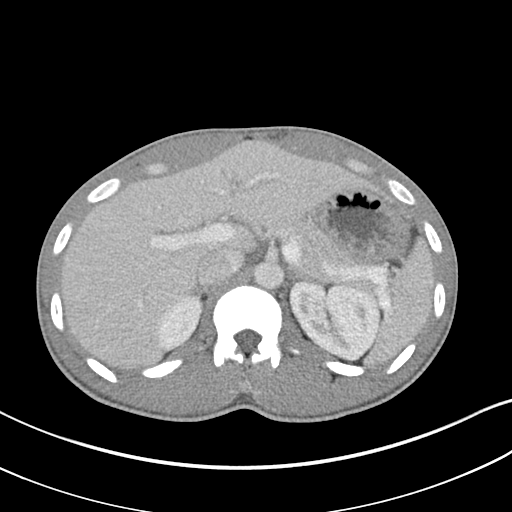
[im 71/84  soft-tissue]
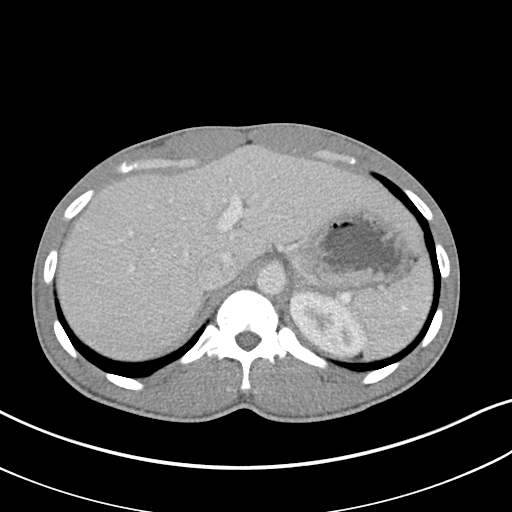
[im 79/84  soft-tissue]
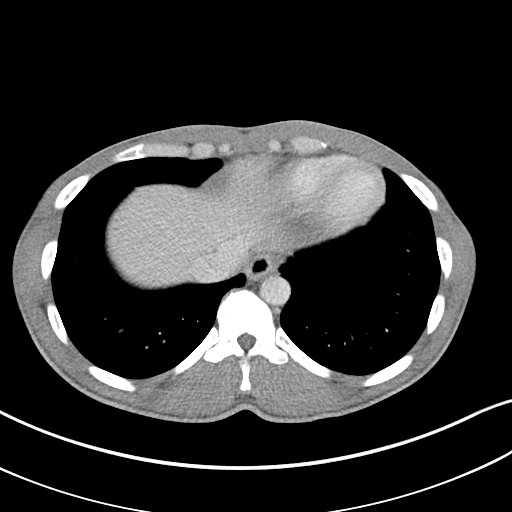

[Series 4: coronal st · coronal · 0.62mm/px · 3 of 109 slices shown]
[im 37/109  soft-tissue]
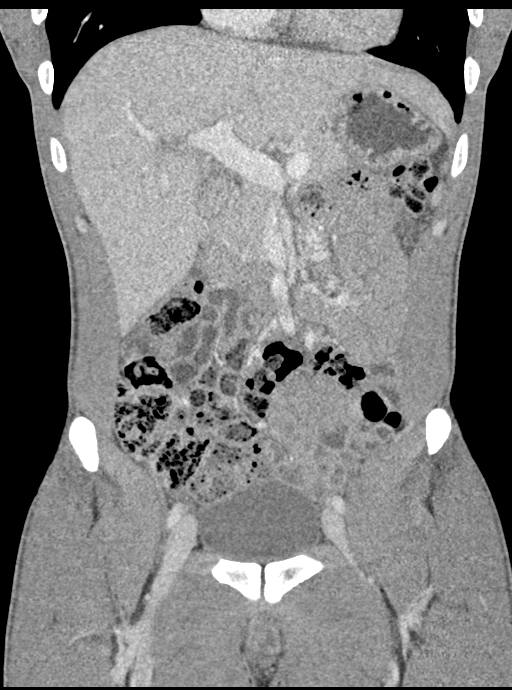
[im 49/109  soft-tissue]
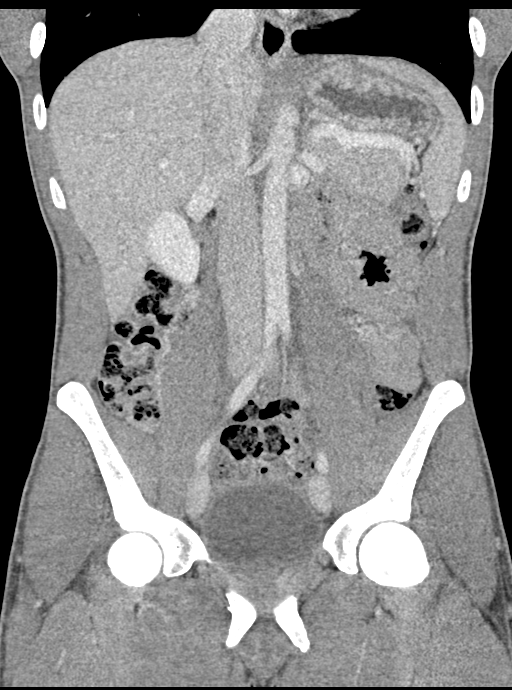
[im 61/109  soft-tissue]
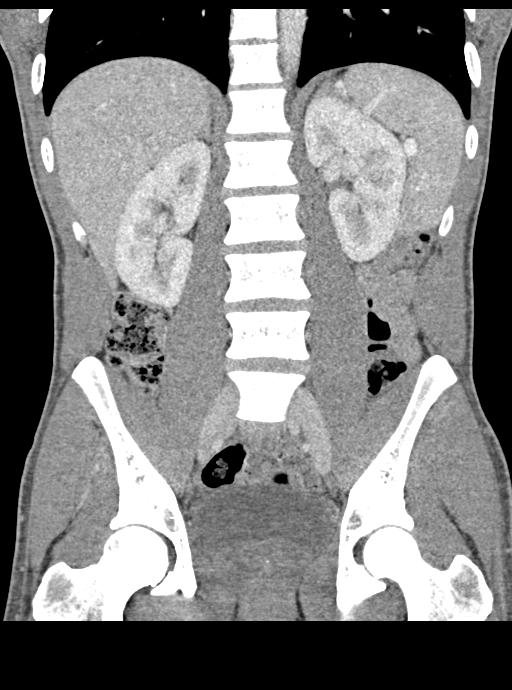

[15 of 46 positions shown; findings below may reference images not displayed]

RADIATION DOSE REDUCTION: This exam was performed according to the
departmental dose-optimization program which includes automated
exposure control, adjustment of the mA and/or kV according to
patient size and/or use of iterative reconstruction technique.

CONTRAST:  100mL OMNIPAQUE IOHEXOL 300 MG/ML  SOLN
FINDINGS: Lower chest: Negative.

Hepatobiliary: Stable liver with tiny subcapsular circumscribed
low-density area measuring 5-6 mm on series 2, image 32, most likely
a small benign cyst. Negative gallbladder. No bile duct enlargement.

Pancreas: Negative.

Spleen: Negative.

Adrenals/Urinary Tract: Negative. Symmetric renal enhancement. No
pararenal inflammation, hydronephrosis, or urinary calculus
identified.

Stomach/Bowel: Paucity of intra-abdominal fat planes limits bowel
detail. No oral contrast. No dilated large or small bowel loops.
Retained stool throughout much of the colon. Some flocculated
material also in nondilated distal small bowel. Evidence of normal
gas within a nondilated appendix medial to the cecum on coronal
images 38 through 45. no free air, free fluid, or focal bowel
inflammation is identified.

Vascular/Lymphatic: Major arterial structures appear patent and
normal. Portal venous system, and central venous structures in the
pelvis appear to be patent. No lymphadenopathy identified.

Reproductive: Negative.

Other: No pelvic free fluid.

Musculoskeletal: Negative.
IMPRESSION: 1. Limited bowel detail due to paucity of intra-abdominal fat, but
evidence of a normal appendix medial to the cecum.
If clinical suspicion persists a repeat CT Abdomen and Pelvis with
both oral and IV contrast in 12-24 hours may be valuable.

2. No acute or inflammatory process identified in the abdomen or
pelvis.

## 2023-08-29 ENCOUNTER — Emergency Department (HOSPITAL_COMMUNITY)
Admission: EM | Admit: 2023-08-29 | Discharge: 2023-08-29 | Disposition: A | Discharge status: Home / Self Care | Payer: No Typology Code available for payment source | Attending: Emergency Medicine | Admitting: Emergency Medicine

## 2023-08-29 ENCOUNTER — Other Ambulatory Visit: Payer: Self-pay

## 2023-08-29 DIAGNOSIS — R6889 Other general symptoms and signs: Secondary | ICD-10-CM | POA: Insufficient documentation

## 2023-08-29 DIAGNOSIS — I10 Essential (primary) hypertension: Secondary | ICD-10-CM | POA: Diagnosis not present

## 2023-08-29 DIAGNOSIS — R197 Diarrhea, unspecified: Secondary | ICD-10-CM | POA: Diagnosis not present

## 2023-08-29 DIAGNOSIS — R112 Nausea with vomiting, unspecified: Secondary | ICD-10-CM | POA: Insufficient documentation

## 2023-08-29 DIAGNOSIS — R109 Unspecified abdominal pain: Secondary | ICD-10-CM | POA: Diagnosis present

## 2023-08-29 DIAGNOSIS — Z79899 Other long term (current) drug therapy: Secondary | ICD-10-CM | POA: Diagnosis not present

## 2023-08-29 LAB — COMPREHENSIVE METABOLIC PANEL
ALT: 22 U/L (ref 0–44)
AST: 18 U/L (ref 15–41)
Albumin: 4.1 g/dL (ref 3.5–5.0)
Alkaline Phosphatase: 45 U/L (ref 38–126)
Anion gap: 8 (ref 5–15)
BUN: 21 mg/dL — ABNORMAL HIGH (ref 6–20)
CO2: 24 mmol/L (ref 22–32)
Calcium: 8.9 mg/dL (ref 8.9–10.3)
Chloride: 104 mmol/L (ref 98–111)
Creatinine, Ser: 0.81 mg/dL (ref 0.61–1.24)
GFR, Estimated: >60 mL/min (ref >60)
Glucose, Bld: 104 mg/dL — ABNORMAL HIGH (ref 70–99)
Potassium: 3.7 mmol/L (ref 3.5–5.1)
Sodium: 136 mmol/L (ref 135–145)
Total Bilirubin: 0.7 mg/dL (ref 0.0–1.2)
Total Protein: 7.5 g/dL (ref 6.5–8.1)

## 2023-08-29 LAB — URINALYSIS, ROUTINE W REFLEX MICROSCOPIC
Bacteria, UA: NONE SEEN
Bilirubin Urine: NEGATIVE
Glucose, UA: NEGATIVE mg/dL
Hgb urine dipstick: NEGATIVE
Ketones, ur: NEGATIVE mg/dL
Leukocytes,Ua: NEGATIVE
Nitrite: NEGATIVE
Protein, ur: 30 mg/dL — AB
Specific Gravity, Urine: 1.035 — ABNORMAL HIGH (ref 1.005–1.030)
pH: 5 (ref 5.0–8.0)

## 2023-08-29 LAB — CBC
HCT: 46.9 % (ref 39.0–52.0)
Hemoglobin: 15.9 g/dL (ref 13.0–17.0)
MCH: 30.9 pg (ref 26.0–34.0)
MCHC: 33.9 g/dL (ref 30.0–36.0)
MCV: 91.1 fL (ref 80.0–100.0)
Platelets: 174 10*3/uL (ref 150–400)
RBC: 5.15 MIL/uL (ref 4.22–5.81)
RDW: 11.2 % — ABNORMAL LOW (ref 11.5–15.5)
WBC: 3.6 10*3/uL — ABNORMAL LOW (ref 4.0–10.5)
nRBC: 0 % (ref 0.0–0.2)

## 2023-08-29 LAB — LIPASE, BLOOD: Lipase: 36 U/L (ref 11–51)

## 2023-08-29 MED ORDER — ONDANSETRON 4 MG PO TBDP
4.0000 mg | ORAL_TABLET | Freq: Once | ORAL | Status: AC
  Administered 2023-08-29: 4 mg via ORAL
  Filled 2023-08-29: qty 1

## 2023-08-29 MED ORDER — LOPERAMIDE HCL 2 MG PO CAPS
2.0000 mg | ORAL_CAPSULE | Freq: Four times a day (QID) | ORAL | 0 refills | Status: AC | PRN
Start: 2023-08-29 — End: ?

## 2023-08-29 MED ORDER — LOPERAMIDE HCL 2 MG PO CAPS
4.0000 mg | ORAL_CAPSULE | Freq: Once | ORAL | Status: AC
  Administered 2023-08-29: 4 mg via ORAL
  Filled 2023-08-29: qty 2

## 2023-08-29 MED ORDER — ONDANSETRON 4 MG PO TBDP: 4.0000 mg | ORAL_TABLET | Freq: Three times a day (TID) | ORAL | 0 refills | Status: AC | PRN

## 2023-08-29 NOTE — Discharge Instructions (Signed)
You were seen today for viral symptoms.  Likely have the flu given your sick contact.  Make sure that you are staying hydrated.  Take Zofran and Imodium as needed.  Take Tylenol Motrin as needed for any body aches or pains.

## 2023-08-29 NOTE — ED Provider Notes (Signed)
[place] EMERGENCY DEPARTMENT AT [facility] Provider Note   CSN: [id number] Arrival date & time: 08/29/23  1918     History  Chief Complaint  Patient presents with   Abdominal Pain    Patient c/o n/v/d, and night sweats since Sunday. Patient reported "My legs feel tired like I did a workout or ran" and I have a migraine. Patient stated he ate some fried chicken yams macaroni and corn bread from a place called [employer] and later that night about 2 am he started with the diarrhea.     John Collier is a 33 y.o. male.  HPI     This is a 33 year old male who presents with nausea, vomiting, diarrhea, and chills.  Onset of symptoms on Sunday.  He states that his sister was diagnosed with the flu and had similar symptoms.  He reports multiple episodes of nonbloody diarrhea and nonbilious emesis.  No significant abdominal pain.  Reports headache.  Home Medications Prior to Admission medications   Medication Sig Start Date End Date Taking? Authorizing Provider  dicyclomine (BENTYL) 20 MG tablet Take 1 tablet (20 mg total) by mouth every 8 (eight) hours as needed for spasms. 07/28/21   Petrucelli, Samantha R, PA-C  ibuprofen (ADVIL) 800 MG tablet Take 1 tablet (800 mg total) by mouth 3 (three) times daily. 10/17/21   Janell Quiet, PA-C  lisinopril (ZESTRIL) 5 MG tablet Take 1 tablet (5 mg total) by mouth daily. 02/19/21   Charlynne Pander, MD  methocarbamol (ROBAXIN) 500 MG tablet Take 1 tablet (500 mg total) by mouth 2 (two) times daily. 10/17/21   Janell Quiet, PA-C      Allergies    Patient has no known allergies.    Review of Systems   Review of Systems  Constitutional:  Positive for chills. Negative for fever.  Respiratory:  Negative for shortness of breath.   Cardiovascular:  Negative for chest pain.  Gastrointestinal:  Positive for diarrhea, nausea and vomiting. Negative for abdominal pain.  All other systems reviewed and are negative.   Physical  Exam Updated Vital Signs BP 122/76   Pulse 85   Temp 98.2 F (36.8 C) (Oral)   Resp 18   SpO2 97%  Physical Exam Vitals and nursing note reviewed.  Constitutional:      Appearance: He is well-developed. He is not ill-appearing.  HENT:     Head: Normocephalic and atraumatic.  Eyes:     Pupils: Pupils are equal, round, and reactive to light.  Cardiovascular:     Rate and Rhythm: Normal rate and regular rhythm.     Heart sounds: Normal heart sounds. No murmur heard. Pulmonary:     Effort: Pulmonary effort is normal. No respiratory distress.     Breath sounds: Normal breath sounds. No wheezing.  Abdominal:     General: Bowel sounds are normal.     Palpations: Abdomen is soft.     Tenderness: There is no abdominal tenderness. There is no guarding or rebound.  Musculoskeletal:     Cervical back: Neck supple.  Lymphadenopathy:     Cervical: No cervical adenopathy.  Skin:    General: Skin is warm and dry.  Neurological:     Mental Status: He is alert and oriented to person, place, and time.  Psychiatric:        Mood and Affect: Mood normal.     ED Results / Procedures / Treatments   Labs (all labs ordered are listed, but  only abnormal results are displayed) Labs Reviewed  COMPREHENSIVE METABOLIC PANEL - Abnormal; Notable for the following components:      Result Value   Glucose, Bld 104 (*)    BUN 21 (*)    All other components within normal limits  CBC - Abnormal; Notable for the following components:   WBC 3.6 (*)    RDW 11.2 (*)    All other components within normal limits  URINALYSIS, ROUTINE W REFLEX MICROSCOPIC - Abnormal; Notable for the following components:   Specific Gravity, Urine 1.035 (*)    Protein, ur 30 (*)    All other components within normal limits  RESP PANEL BY RT-PCR (RSV, FLU A&B, COVID)  [id number]  LIPASE, BLOOD    EKG None  Radiology No results found.  Procedures Procedures    Medications Ordered in ED Medications  ondansetron  (ZOFRAN-ODT) disintegrating tablet 4 mg (has no administration in time range)  loperamide (IMODIUM) capsule 4 mg (has no administration in time range)    ED Course/ Medical Decision Making/ A&P                                 Medical Decision Making Amount and/or Complexity of Data Reviewed Labs: ordered.  Risk Prescription drug management.   This patient presents to the ED for concern of nausea, vomiting, diarrhea, this involves an extensive number of treatment options, and is a complaint that carries with it a high risk of complications and morbidity.  I considered the following differential and admission for this acute, potentially life threatening condition.  The differential diagnosis includes viral illness such as COVID or influenza, gastroenteritis, gastritis, less likely intra-abdominal pathology  MDM:    This is a 33 year old male who presents with nausea, vomiting, diarrhea.  Sister with similar symptoms and diagnosed with flu.  Also reports chills.  He is afebrile here.  Vital signs are reassuring.  Abdominal exam is benign.  Labs sent from triage are largely unremarkable.  Patient given Zofran and Imodium.  Flu swab sent.  Patient will check MyChart for results.  Recommend supportive measures.  (Labs, imaging, consults)  Labs: I Ordered, and personally interpreted labs.  The pertinent results include: CBC, CMP, lipase, urinalysis  Imaging Studies ordered: I ordered imaging studies including none I independently visualized and interpreted imaging. I agree with the radiologist interpretation  Additional history obtained from chart review.  External records from outside source obtained and reviewed including prior evaluations  Cardiac Monitoring: The patient was not maintained on a cardiac monitor.  If on the cardiac monitor, I personally viewed and interpreted the cardiac monitored which showed an underlying rhythm of: N/A  Reevaluation: After the interventions noted  above, I reevaluated the patient and found that they have :stayed the same  Social Determinants of Health:  lives independently  Disposition: Discharge  Co morbidities that complicate the patient evaluation  Past Medical History:  Diagnosis Date   Hypertension      Medicines Meds ordered this encounter  Medications   ondansetron (ZOFRAN-ODT) disintegrating tablet 4 mg   loperamide (IMODIUM) capsule 4 mg    I have reviewed the patients home medicines and have made adjustments as needed  Problem List / ED Course: Problem List Items Addressed This Visit   None Visit Diagnoses       Flu-like symptoms    -  Primary  Final Clinical Impression(s) / ED Diagnoses Final diagnoses:  Flu-like symptoms    Rx / DC Orders ED Discharge Orders     None         Shon Baton, MD 08/29/23 2342

## 2023-08-30 LAB — RESP PANEL BY RT-PCR (RSV, FLU A&B, COVID)  RVPGX2
Influenza A by PCR: NEGATIVE
Influenza B by PCR: NEGATIVE
Resp Syncytial Virus by PCR: NEGATIVE
SARS Coronavirus 2 by RT PCR: NEGATIVE
